# Patient Record
Sex: Female | Born: 1980 | Race: Black or African American | Hispanic: No | Marital: Single | Smoking: Never smoker
Health system: Southern US, Community
[De-identification: ages and names within clinical notes are randomized; demographics above are authoritative.]

## PROBLEM LIST (undated history)

## (undated) DIAGNOSIS — D573 Sickle-cell trait: Secondary | ICD-10-CM

---

## 2001-07-13 ENCOUNTER — Emergency Department (HOSPITAL_COMMUNITY): Admission: EM | Admit: 2001-07-13 | Discharge: 2001-07-13 | Payer: Self-pay | Admitting: Emergency Medicine

## 2001-07-14 ENCOUNTER — Encounter: Payer: Self-pay | Admitting: *Deleted

## 2001-07-14 ENCOUNTER — Emergency Department (HOSPITAL_COMMUNITY): Admission: EM | Admit: 2001-07-14 | Discharge: 2001-07-14 | Payer: Self-pay | Admitting: *Deleted

## 2005-01-07 ENCOUNTER — Emergency Department: Payer: Self-pay | Admitting: Emergency Medicine

## 2005-10-15 ENCOUNTER — Emergency Department: Payer: Self-pay | Admitting: Emergency Medicine

## 2007-02-06 ENCOUNTER — Encounter: Payer: Self-pay | Admitting: Maternal & Fetal Medicine

## 2007-03-06 ENCOUNTER — Encounter: Payer: Self-pay | Admitting: Maternal & Fetal Medicine

## 2007-03-27 ENCOUNTER — Encounter: Payer: Self-pay | Admitting: Maternal & Fetal Medicine

## 2007-05-11 ENCOUNTER — Encounter: Payer: Self-pay | Admitting: Maternal & Fetal Medicine

## 2007-06-08 ENCOUNTER — Encounter: Payer: Self-pay | Admitting: Maternal & Fetal Medicine

## 2007-07-06 ENCOUNTER — Encounter: Payer: Self-pay | Admitting: Maternal & Fetal Medicine

## 2007-07-29 ENCOUNTER — Observation Stay: Payer: Self-pay

## 2007-08-05 ENCOUNTER — Observation Stay: Payer: Self-pay

## 2007-08-11 ENCOUNTER — Observation Stay: Payer: Self-pay | Admitting: Obstetrics and Gynecology

## 2007-08-13 ENCOUNTER — Inpatient Hospital Stay: Payer: Self-pay | Admitting: Obstetrics & Gynecology

## 2009-03-16 ENCOUNTER — Emergency Department (HOSPITAL_COMMUNITY): Admission: EM | Admit: 2009-03-16 | Discharge: 2009-03-16 | Payer: Self-pay | Admitting: Emergency Medicine

## 2009-05-14 ENCOUNTER — Emergency Department: Payer: Self-pay | Admitting: Emergency Medicine

## 2010-07-29 LAB — URINALYSIS, ROUTINE W REFLEX MICROSCOPIC
Bilirubin Urine: NEGATIVE
Glucose, UA: NEGATIVE mg/dL
Ketones, ur: NEGATIVE mg/dL
Leukocytes, UA: NEGATIVE
Nitrite: NEGATIVE
Protein, ur: NEGATIVE mg/dL
Specific Gravity, Urine: 1.015 (ref 1.005–1.030)
Urobilinogen, UA: 1 mg/dL (ref 0.0–1.0)
pH: 6 (ref 5.0–8.0)

## 2010-07-29 LAB — URINE MICROSCOPIC-ADD ON

## 2010-07-29 LAB — RAPID STREP SCREEN (MED CTR MEBANE ONLY): Streptococcus, Group A Screen (Direct): POSITIVE — AB

## 2010-07-29 LAB — URINE CULTURE: Colony Count: 40000

## 2010-09-18 ENCOUNTER — Emergency Department: Payer: Self-pay | Admitting: Emergency Medicine

## 2010-10-19 ENCOUNTER — Encounter: Payer: Self-pay | Admitting: Family Medicine

## 2010-10-25 ENCOUNTER — Encounter: Payer: Self-pay | Admitting: Family Medicine

## 2010-11-18 DIAGNOSIS — E669 Obesity, unspecified: Secondary | ICD-10-CM | POA: Insufficient documentation

## 2010-11-18 DIAGNOSIS — M549 Dorsalgia, unspecified: Secondary | ICD-10-CM | POA: Insufficient documentation

## 2010-11-18 DIAGNOSIS — M25569 Pain in unspecified knee: Secondary | ICD-10-CM | POA: Insufficient documentation

## 2011-03-07 ENCOUNTER — Emergency Department: Payer: Self-pay | Admitting: Internal Medicine

## 2012-05-16 ENCOUNTER — Emergency Department: Payer: Self-pay | Admitting: Emergency Medicine

## 2012-05-16 LAB — BASIC METABOLIC PANEL
Anion Gap: 6 — ABNORMAL LOW (ref 7–16)
Co2: 26 mmol/L (ref 21–32)
EGFR (Non-African Amer.): >60
Glucose: 106 mg/dL — ABNORMAL HIGH (ref 65–99)
Potassium: 4 mmol/L (ref 3.5–5.1)
Sodium: 139 mmol/L (ref 136–145)

## 2012-05-16 LAB — CBC
MCH: 24.5 pg — ABNORMAL LOW (ref 26.0–34.0)
MCHC: 34 g/dL (ref 32.0–36.0)
RBC: 4.68 10*6/uL (ref 3.80–5.20)
RDW: 17 % — ABNORMAL HIGH (ref 11.5–14.5)
WBC: 10 10*3/uL (ref 3.6–11.0)

## 2012-05-16 LAB — CK TOTAL AND CKMB (NOT AT ARMC): CK-MB: <0.5 ng/mL — ABNORMAL LOW (ref 0.5–3.6)

## 2012-05-23 ENCOUNTER — Emergency Department: Payer: Self-pay | Admitting: Emergency Medicine

## 2012-05-23 LAB — URINALYSIS, COMPLETE
Bilirubin,UR: NEGATIVE
Glucose,UR: NEGATIVE mg/dL (ref 0–75)
Ketone: NEGATIVE
Ph: 6 (ref 4.5–8.0)
Protein: NEGATIVE
RBC,UR: <1 /HPF (ref 0–5)
Squamous Epithelial: 4
WBC UR: 2 /HPF (ref 0–5)

## 2012-05-23 LAB — CBC
HGB: 11.6 g/dL — ABNORMAL LOW (ref 12.0–16.0)
MCH: 24.3 pg — ABNORMAL LOW (ref 26.0–34.0)
MCHC: 33.6 g/dL (ref 32.0–36.0)
MCV: 72 fL — ABNORMAL LOW (ref 80–100)
Platelet: 157 10*3/uL (ref 150–440)
RBC: 4.79 10*6/uL (ref 3.80–5.20)
RDW: 16.8 % — ABNORMAL HIGH (ref 11.5–14.5)
WBC: 9.3 10*3/uL (ref 3.6–11.0)

## 2012-05-23 LAB — COMPREHENSIVE METABOLIC PANEL
Albumin: 3.4 g/dL (ref 3.4–5.0)
Alkaline Phosphatase: 67 U/L (ref 50–136)
Anion Gap: 9 (ref 7–16)
Bilirubin,Total: 0.3 mg/dL (ref 0.2–1.0)
Calcium, Total: 8.4 mg/dL — ABNORMAL LOW (ref 8.5–10.1)
Chloride: 108 mmol/L — ABNORMAL HIGH (ref 98–107)
EGFR (African American): >60
EGFR (Non-African Amer.): >60
SGOT(AST): 17 U/L (ref 15–37)
SGPT (ALT): 28 U/L (ref 12–78)
Sodium: 142 mmol/L (ref 136–145)

## 2012-06-13 ENCOUNTER — Emergency Department: Payer: Self-pay | Admitting: Emergency Medicine

## 2012-09-27 ENCOUNTER — Emergency Department: Payer: Self-pay | Admitting: Emergency Medicine

## 2012-12-18 ENCOUNTER — Emergency Department: Payer: Self-pay | Admitting: Emergency Medicine

## 2012-12-18 LAB — COMPREHENSIVE METABOLIC PANEL
Albumin: 3.5 g/dL (ref 3.4–5.0)
Alkaline Phosphatase: 73 U/L (ref 50–136)
BUN: 10 mg/dL (ref 7–18)
Bilirubin,Total: 0.6 mg/dL (ref 0.2–1.0)
Chloride: 106 mmol/L (ref 98–107)
Co2: 26 mmol/L (ref 21–32)
Creatinine: 0.95 mg/dL (ref 0.60–1.30)
EGFR (Non-African Amer.): >60
Glucose: 101 mg/dL — ABNORMAL HIGH (ref 65–99)
Osmolality: 275 (ref 275–301)
SGPT (ALT): 24 U/L (ref 12–78)

## 2012-12-18 LAB — CBC
HCT: 35.2 % (ref 35.0–47.0)
MCH: 24.7 pg — ABNORMAL LOW (ref 26.0–34.0)
MCV: 71 fL — ABNORMAL LOW (ref 80–100)
RDW: 16.4 % — ABNORMAL HIGH (ref 11.5–14.5)
WBC: 9.8 10*3/uL (ref 3.6–11.0)

## 2013-01-01 ENCOUNTER — Ambulatory Visit: Payer: Self-pay | Admitting: Family Medicine

## 2013-01-10 ENCOUNTER — Encounter: Payer: Self-pay | Admitting: Family Medicine

## 2013-02-08 ENCOUNTER — Emergency Department: Payer: Self-pay | Admitting: Emergency Medicine

## 2013-02-08 LAB — URINALYSIS, COMPLETE
Glucose,UR: NEGATIVE mg/dL (ref 0–75)
Ketone: NEGATIVE
Leukocyte Esterase: NEGATIVE
Nitrite: NEGATIVE
Ph: 6 (ref 4.5–8.0)
Protein: NEGATIVE
RBC,UR: 3 /HPF (ref 0–5)
Specific Gravity: 1.021 (ref 1.003–1.030)
Squamous Epithelial: 6
WBC UR: 1 /HPF (ref 0–5)

## 2013-02-08 LAB — COMPREHENSIVE METABOLIC PANEL
Albumin: 3.3 g/dL — ABNORMAL LOW (ref 3.4–5.0)
Alkaline Phosphatase: 71 U/L (ref 50–136)
BUN: 9 mg/dL (ref 7–18)
Bilirubin,Total: 0.4 mg/dL (ref 0.2–1.0)
Calcium, Total: 8.7 mg/dL (ref 8.5–10.1)
Chloride: 107 mmol/L (ref 98–107)
EGFR (African American): >60
EGFR (Non-African Amer.): >60
Glucose: 105 mg/dL — ABNORMAL HIGH (ref 65–99)
Osmolality: 278 (ref 275–301)
SGOT(AST): 15 U/L (ref 15–37)
SGPT (ALT): 30 U/L (ref 12–78)
Sodium: 140 mmol/L (ref 136–145)

## 2013-02-08 LAB — CBC
HGB: 11.4 g/dL — ABNORMAL LOW (ref 12.0–16.0)
MCH: 25 pg — ABNORMAL LOW (ref 26.0–34.0)
Platelet: 160 10*3/uL (ref 150–440)
RBC: 4.55 10*6/uL (ref 3.80–5.20)
RDW: 16.5 % — ABNORMAL HIGH (ref 11.5–14.5)
WBC: 9.8 10*3/uL (ref 3.6–11.0)

## 2013-02-08 LAB — TROPONIN I: Troponin-I: <0.02 ng/mL

## 2013-02-08 LAB — LIPASE, BLOOD: Lipase: 69 U/L — ABNORMAL LOW (ref 73–393)

## 2013-02-08 LAB — PREGNANCY, URINE: Pregnancy Test, Urine: NEGATIVE m[IU]/mL

## 2013-02-09 ENCOUNTER — Emergency Department: Payer: Self-pay | Admitting: Emergency Medicine

## 2013-02-13 ENCOUNTER — Emergency Department: Payer: Self-pay | Admitting: Emergency Medicine

## 2013-02-13 LAB — URINALYSIS, COMPLETE
Bilirubin,UR: NEGATIVE
Blood: NEGATIVE
Hyaline Cast: 22
Ketone: NEGATIVE
Leukocyte Esterase: NEGATIVE
Nitrite: NEGATIVE
Protein: NEGATIVE

## 2013-02-13 LAB — CBC
HCT: 34.1 % — ABNORMAL LOW (ref 35.0–47.0)
HGB: 11.9 g/dL — ABNORMAL LOW (ref 12.0–16.0)
MCHC: 35 g/dL (ref 32.0–36.0)
MCV: 71 fL — ABNORMAL LOW (ref 80–100)
Platelet: 159 10*3/uL (ref 150–440)
RDW: 16.6 % — ABNORMAL HIGH (ref 11.5–14.5)

## 2013-02-13 LAB — LIPASE, BLOOD: Lipase: 58 U/L — ABNORMAL LOW (ref 73–393)

## 2013-02-28 ENCOUNTER — Emergency Department: Payer: Self-pay | Admitting: Emergency Medicine

## 2013-02-28 LAB — COMPREHENSIVE METABOLIC PANEL
Albumin: 3.4 g/dL (ref 3.4–5.0)
Anion Gap: 4 — ABNORMAL LOW (ref 7–16)
Bilirubin,Total: 0.4 mg/dL (ref 0.2–1.0)
Calcium, Total: 8.7 mg/dL (ref 8.5–10.1)
Chloride: 110 mmol/L — ABNORMAL HIGH (ref 98–107)
Creatinine: 1.11 mg/dL (ref 0.60–1.30)
EGFR (African American): >60
EGFR (Non-African Amer.): >60
Glucose: 112 mg/dL — ABNORMAL HIGH (ref 65–99)
Osmolality: 280 (ref 275–301)
Potassium: 3.7 mmol/L (ref 3.5–5.1)
SGPT (ALT): 32 U/L (ref 12–78)
Sodium: 140 mmol/L (ref 136–145)
Total Protein: 7 g/dL (ref 6.4–8.2)

## 2013-02-28 LAB — URINALYSIS, COMPLETE
Bacteria: NONE SEEN
Bilirubin,UR: NEGATIVE
Glucose,UR: NEGATIVE mg/dL (ref 0–75)
Leukocyte Esterase: NEGATIVE
Ph: 6 (ref 4.5–8.0)
Protein: NEGATIVE
RBC,UR: 1 /HPF (ref 0–5)
Squamous Epithelial: 5

## 2013-02-28 LAB — CBC
MCH: 25.1 pg — ABNORMAL LOW (ref 26.0–34.0)
RDW: 16.7 % — ABNORMAL HIGH (ref 11.5–14.5)
WBC: 10.3 10*3/uL (ref 3.6–11.0)

## 2013-02-28 LAB — LIPASE, BLOOD: Lipase: 54 U/L — ABNORMAL LOW (ref 73–393)

## 2013-03-03 ENCOUNTER — Emergency Department: Payer: Self-pay | Admitting: Emergency Medicine

## 2013-03-03 LAB — COMPREHENSIVE METABOLIC PANEL
BUN: 10 mg/dL (ref 7–18)
Bilirubin,Total: 0.4 mg/dL (ref 0.2–1.0)
Calcium, Total: 8.5 mg/dL (ref 8.5–10.1)
Chloride: 108 mmol/L — ABNORMAL HIGH (ref 98–107)
Co2: 27 mmol/L (ref 21–32)
EGFR (African American): >60
EGFR (Non-African Amer.): >60
Glucose: 109 mg/dL — ABNORMAL HIGH (ref 65–99)
Osmolality: 281 (ref 275–301)
SGOT(AST): 23 U/L (ref 15–37)
SGPT (ALT): 32 U/L (ref 12–78)
Sodium: 141 mmol/L (ref 136–145)

## 2013-03-03 LAB — URINALYSIS, COMPLETE
Blood: NEGATIVE
Leukocyte Esterase: NEGATIVE
Nitrite: NEGATIVE
Protein: NEGATIVE
Specific Gravity: 1.01 (ref 1.003–1.030)
Squamous Epithelial: 7

## 2013-03-03 LAB — CBC
HCT: 33 % — ABNORMAL LOW (ref 35.0–47.0)
MCH: 25.1 pg — ABNORMAL LOW (ref 26.0–34.0)
MCV: 71 fL — ABNORMAL LOW (ref 80–100)
RDW: 16.2 % — ABNORMAL HIGH (ref 11.5–14.5)

## 2013-03-24 ENCOUNTER — Emergency Department: Payer: Self-pay | Admitting: Emergency Medicine

## 2013-03-24 LAB — BASIC METABOLIC PANEL
Chloride: 108 mmol/L — ABNORMAL HIGH (ref 98–107)
Co2: 28 mmol/L (ref 21–32)
EGFR (African American): >60
EGFR (Non-African Amer.): >60
Osmolality: 277 (ref 275–301)
Potassium: 3.9 mmol/L (ref 3.5–5.1)

## 2013-03-24 LAB — CBC
HCT: 34.8 % — ABNORMAL LOW (ref 35.0–47.0)
MCHC: 33.9 g/dL (ref 32.0–36.0)
RBC: 4.84 10*6/uL (ref 3.80–5.20)

## 2013-03-24 LAB — TROPONIN I: Troponin-I: <0.02 ng/mL

## 2013-03-25 LAB — CK TOTAL AND CKMB (NOT AT ARMC): CK-MB: 0.9 ng/mL (ref 0.5–3.6)

## 2013-04-01 ENCOUNTER — Emergency Department: Payer: Self-pay | Admitting: Emergency Medicine

## 2013-04-01 LAB — URINALYSIS, COMPLETE
Bacteria: NONE SEEN
Bilirubin,UR: NEGATIVE
Ketone: NEGATIVE
Nitrite: NEGATIVE
Ph: 5 (ref 4.5–8.0)
WBC UR: 2 /HPF (ref 0–5)

## 2013-04-02 LAB — PREGNANCY, URINE: Pregnancy Test, Urine: NEGATIVE m[IU]/mL

## 2013-04-16 ENCOUNTER — Emergency Department: Payer: Self-pay | Admitting: Emergency Medicine

## 2013-05-03 ENCOUNTER — Emergency Department: Payer: Self-pay | Admitting: Emergency Medicine

## 2013-05-03 LAB — CBC
HCT: 33.2 % — ABNORMAL LOW (ref 35.0–47.0)
HGB: 11.4 g/dL — ABNORMAL LOW (ref 12.0–16.0)
MCH: 24.7 pg — ABNORMAL LOW (ref 26.0–34.0)
MCHC: 34.3 g/dL (ref 32.0–36.0)
MCV: 72 fL — AB (ref 80–100)
PLATELETS: 158 10*3/uL (ref 150–440)
RBC: 4.62 10*6/uL (ref 3.80–5.20)
RDW: 15.8 % — AB (ref 11.5–14.5)
WBC: 9 10*3/uL (ref 3.6–11.0)

## 2013-05-03 LAB — BASIC METABOLIC PANEL
ANION GAP: 4 — AB (ref 7–16)
BUN: 13 mg/dL (ref 7–18)
Calcium, Total: 8.1 mg/dL — ABNORMAL LOW (ref 8.5–10.1)
Chloride: 105 mmol/L (ref 98–107)
Co2: 27 mmol/L (ref 21–32)
Creatinine: 0.88 mg/dL (ref 0.60–1.30)
EGFR (African American): >60
EGFR (Non-African Amer.): >60
Glucose: 98 mg/dL (ref 65–99)
OSMOLALITY: 272 (ref 275–301)
POTASSIUM: 3.6 mmol/L (ref 3.5–5.1)
Sodium: 136 mmol/L (ref 136–145)

## 2013-05-03 LAB — TROPONIN I

## 2013-06-23 ENCOUNTER — Emergency Department (HOSPITAL_COMMUNITY)
Admission: EM | Admit: 2013-06-23 | Discharge: 2013-06-23 | Disposition: A | Discharge status: Home / Self Care | Payer: Medicaid Other | Attending: Emergency Medicine | Admitting: Emergency Medicine

## 2013-06-23 ENCOUNTER — Encounter (HOSPITAL_COMMUNITY): Payer: Self-pay | Admitting: Emergency Medicine

## 2013-06-23 DIAGNOSIS — R109 Unspecified abdominal pain: Secondary | ICD-10-CM

## 2013-06-23 DIAGNOSIS — Z3202 Encounter for pregnancy test, result negative: Secondary | ICD-10-CM | POA: Insufficient documentation

## 2013-06-23 LAB — CBC WITH DIFFERENTIAL/PLATELET
Basophils Absolute: 0 10*3/uL (ref 0.0–0.1)
Basophils Relative: 0 % (ref 0–1)
EOS PCT: 1 % (ref 0–5)
Eosinophils Absolute: 0.1 10*3/uL (ref 0.0–0.7)
HEMATOCRIT: 33.2 % — AB (ref 36.0–46.0)
Hemoglobin: 11.6 g/dL — ABNORMAL LOW (ref 12.0–15.0)
LYMPHS ABS: 3.2 10*3/uL (ref 0.7–4.0)
LYMPHS PCT: 37 % (ref 12–46)
MCH: 24.8 pg — ABNORMAL LOW (ref 26.0–34.0)
MCHC: 34.9 g/dL (ref 30.0–36.0)
MCV: 70.9 fL — ABNORMAL LOW (ref 78.0–100.0)
MONO ABS: 0.6 10*3/uL (ref 0.1–1.0)
Monocytes Relative: 7 % (ref 3–12)
NEUTROS ABS: 4.9 10*3/uL (ref 1.7–7.7)
Neutrophils Relative %: 56 % (ref 43–77)
PLATELETS: 175 10*3/uL (ref 150–400)
RBC: 4.68 MIL/uL (ref 3.87–5.11)
RDW: 15.7 % — AB (ref 11.5–15.5)
WBC: 8.7 10*3/uL (ref 4.0–10.5)

## 2013-06-23 LAB — BASIC METABOLIC PANEL
BUN: 17 mg/dL (ref 6–23)
CO2: 27 meq/L (ref 19–32)
Calcium: 8.8 mg/dL (ref 8.4–10.5)
Chloride: 102 mEq/L (ref 96–112)
Creatinine, Ser: 0.83 mg/dL (ref 0.50–1.10)
GFR calc Af Amer: >90 mL/min (ref >90)
GFR calc non Af Amer: >90 mL/min (ref >90)
GLUCOSE: 104 mg/dL — AB (ref 70–99)
Potassium: 4 mEq/L (ref 3.7–5.3)
SODIUM: 139 meq/L (ref 137–147)

## 2013-06-23 LAB — URINALYSIS, ROUTINE W REFLEX MICROSCOPIC
Bilirubin Urine: NEGATIVE
Glucose, UA: NEGATIVE mg/dL
Hgb urine dipstick: NEGATIVE
Ketones, ur: NEGATIVE mg/dL
Leukocytes, UA: NEGATIVE
Nitrite: NEGATIVE
Protein, ur: NEGATIVE mg/dL
SPECIFIC GRAVITY, URINE: 1.025 (ref 1.005–1.030)
UROBILINOGEN UA: 1 mg/dL (ref 0.0–1.0)
pH: 6 (ref 5.0–8.0)

## 2013-06-23 LAB — PREGNANCY, URINE: PREG TEST UR: NEGATIVE

## 2013-06-23 MED ORDER — FENTANYL CITRATE 0.05 MG/ML IJ SOLN
50.0000 ug | Freq: Once | INTRAMUSCULAR | Status: AC
  Administered 2013-06-23: 50 ug via INTRAVENOUS
  Filled 2013-06-23 (×2): qty 2

## 2013-06-23 NOTE — ED Provider Notes (Signed)
CSN: [id number]     Arrival date & time 06/23/13  0104 History   First MD Initiated Contact with Patient 06/23/13 [phone]     Chief Complaint  Patient presents with  . Flank Pain      Patient is a 33 y.o. female presenting with flank pain. The history is provided by the patient.  Flank Pain This is a new problem. The current episode started 12 to 24 hours ago. The problem occurs constantly. The problem has been gradually improving. Pertinent negatives include no chest pain, no abdominal pain and no shortness of breath. Exacerbated by: movement/palpation. The symptoms are relieved by rest. Treatments tried: aleve. The treatment provided no relief.  pt reports gradual onset of right flank pain over past 12-24 hours No trauma/falls No cp/sob No pleuritic pain No vomiting/diarrhea She reports mild HA as well No fever No dysuria No vag bleeding/discharge  She reports she has had this in the past but never had a diagnosis No h/o kidney stones    PMH - sickle cel trait  History  Substance Use Topics  . Smoking status: Never Smoker   . Smokeless tobacco: Not on file  . Alcohol Use: No   OB History   Grav Para Term Preterm Abortions TAB SAB Ect Mult Living                 Review of Systems  Constitutional: Negative for fever.  Respiratory: Negative for shortness of breath.   Cardiovascular: Negative for chest pain.  Gastrointestinal: Negative for vomiting and abdominal pain.  Genitourinary: Positive for flank pain. Negative for dysuria, vaginal bleeding and vaginal discharge.  Neurological: Negative for weakness.  All other systems reviewed and are negative.      Allergies  Review of patient's allergies indicates no known allergies.  Home Medications  No current outpatient prescriptions on file. BP 144/77  Pulse 86  Temp(Src) 98.2 F (36.8 C) (Oral)  Resp 22  Ht 5\' 3"  (1.6 m)  Wt 327 lb (148.326 kg)  BMI 57.94 kg/m2  SpO2 100% Physical Exam CONSTITUTIONAL: Well  developed/well nourished HEAD: Normocephalic/atraumatic EYES: EOMI/PERRL ENMT: Mucous membranes moist NECK: supple no meningeal signs SPINE:entire spine nontender CV: S1/S2 noted, no murmurs/rubs/gallops noted LUNGS: Lungs are clear to auscultation bilaterally, no apparent distress ABDOMEN: soft, nontender, no rebound or guarding, no RUQ tenderness GU: mild tenderness over right flank, no bruising noted NEURO: Pt is awake/alert, moves all extremitiesx4 EXTREMITIES: pulses normal, full ROM SKIN: warm, color normal PSYCH: no abnormalities of mood noted  ED Course  Procedures Labs Review Labs Reviewed  CBC WITH DIFFERENTIAL - Abnormal; Notable for the following:    Hemoglobin 11.6 (*)    HCT 33.2 (*)    MCV 70.9 (*)    MCH 24.8 (*)    RDW 15.7 (*)    All other components within normal limits  BASIC METABOLIC PANEL - Abnormal; Notable for the following:    Glucose, Bld 104 (*)    All other components within normal limits  URINALYSIS, ROUTINE W REFLEX MICROSCOPIC  PREGNANCY, URINE   Pt well appearing, no distress, already improving on my evaluation, labs reassuring If she has ureteral stone, it is already resolved, but no hematuria so unlikely No focal weakness/mildine back tenderness to suggest acute spinal emergency No focal abd tenderness to suggest need for emergent imaging No CP/SOB reported Defer further workup We discussed strict return precautions Pt feels comfortable for d/c home  MDM   Final diagnoses:  Flank pain  Nursing notes including past medical history and social history reviewed and considered in documentation Labs/vital reviewed and considered     Joya Gaskins, MD 06/23/13 [phone]

## 2013-06-23 NOTE — Discharge Instructions (Signed)

## 2013-06-23 NOTE — ED Notes (Signed)
PT C/O RIGHT SIDED FLANK PAIN AND A HEADACHE. PT TOOK 2 ALEVE WITH NO RELIEF

## 2013-06-26 ENCOUNTER — Emergency Department: Payer: Self-pay | Admitting: Emergency Medicine

## 2013-06-26 LAB — CBC WITH DIFFERENTIAL/PLATELET
Basophil #: 0.1 10*3/uL (ref 0.0–0.1)
Basophil %: 0.8 %
EOS ABS: 0.1 10*3/uL (ref 0.0–0.7)
Eosinophil %: 0.7 %
HCT: 34 % — ABNORMAL LOW (ref 35.0–47.0)
HGB: 11.8 g/dL — ABNORMAL LOW (ref 12.0–16.0)
LYMPHS PCT: 35.5 %
Lymphocyte #: 3.6 10*3/uL (ref 1.0–3.6)
MCH: 25.2 pg — ABNORMAL LOW (ref 26.0–34.0)
MCHC: 34.6 g/dL (ref 32.0–36.0)
MCV: 73 fL — AB (ref 80–100)
Monocyte #: 0.8 x10 3/mm (ref 0.2–0.9)
Monocyte %: 7.7 %
NEUTROS PCT: 55.3 %
Neutrophil #: 5.6 10*3/uL (ref 1.4–6.5)
PLATELETS: 167 10*3/uL (ref 150–440)
RBC: 4.67 10*6/uL (ref 3.80–5.20)
RDW: 16.2 % — ABNORMAL HIGH (ref 11.5–14.5)
WBC: 10.1 10*3/uL (ref 3.6–11.0)

## 2013-06-26 LAB — URINALYSIS, COMPLETE
BLOOD: NEGATIVE
Bacteria: NONE SEEN
Bilirubin,UR: NEGATIVE
Glucose,UR: NEGATIVE mg/dL (ref 0–75)
KETONE: NEGATIVE
Leukocyte Esterase: NEGATIVE
Nitrite: NEGATIVE
Ph: 5 (ref 4.5–8.0)
Protein: NEGATIVE
RBC,UR: 2 /HPF (ref 0–5)
SPECIFIC GRAVITY: 1.025 (ref 1.003–1.030)
WBC UR: NONE SEEN /HPF (ref 0–5)

## 2013-06-26 LAB — TROPONIN I

## 2013-06-26 LAB — COMPREHENSIVE METABOLIC PANEL
ALT: 26 U/L (ref 12–78)
Albumin: 3.5 g/dL (ref 3.4–5.0)
Alkaline Phosphatase: 66 U/L
Anion Gap: 4 — ABNORMAL LOW (ref 7–16)
BUN: 12 mg/dL (ref 7–18)
Bilirubin,Total: 0.5 mg/dL (ref 0.2–1.0)
CALCIUM: 8.3 mg/dL — AB (ref 8.5–10.1)
CO2: 27 mmol/L (ref 21–32)
Chloride: 107 mmol/L (ref 98–107)
Creatinine: 0.94 mg/dL (ref 0.60–1.30)
EGFR (Non-African Amer.): >60
Glucose: 98 mg/dL (ref 65–99)
OSMOLALITY: 275 (ref 275–301)
Potassium: 3.8 mmol/L (ref 3.5–5.1)
SGOT(AST): 22 U/L (ref 15–37)
SODIUM: 138 mmol/L (ref 136–145)
Total Protein: 7.5 g/dL (ref 6.4–8.2)

## 2013-06-26 LAB — LIPASE, BLOOD: LIPASE: 71 U/L — AB (ref 73–393)

## 2013-06-27 ENCOUNTER — Encounter (HOSPITAL_COMMUNITY): Payer: Self-pay | Admitting: Emergency Medicine

## 2013-06-27 DIAGNOSIS — R51 Headache: Secondary | ICD-10-CM | POA: Insufficient documentation

## 2013-06-27 DIAGNOSIS — R059 Cough, unspecified: Secondary | ICD-10-CM | POA: Insufficient documentation

## 2013-06-27 DIAGNOSIS — R05 Cough: Secondary | ICD-10-CM | POA: Insufficient documentation

## 2013-06-27 DIAGNOSIS — R42 Dizziness and giddiness: Secondary | ICD-10-CM | POA: Insufficient documentation

## 2013-06-27 DIAGNOSIS — J3489 Other specified disorders of nose and nasal sinuses: Secondary | ICD-10-CM | POA: Insufficient documentation

## 2013-06-27 DIAGNOSIS — R0789 Other chest pain: Secondary | ICD-10-CM | POA: Insufficient documentation

## 2013-06-27 NOTE — ED Notes (Signed)
Patient complaining of chest pain with headache starting earlier today.

## 2013-06-27 NOTE — ED Notes (Signed)
Gave copy of EKG to Dr Rubin PayorPickering. Advised MD that patient would be kept in triage until a room is available. No further orders given at this time.

## 2013-06-28 ENCOUNTER — Emergency Department (HOSPITAL_COMMUNITY): Payer: Medicaid Other

## 2013-06-28 ENCOUNTER — Emergency Department (HOSPITAL_COMMUNITY)
Admission: EM | Admit: 2013-06-28 | Discharge: 2013-06-28 | Disposition: A | Discharge status: Home / Self Care | Payer: Medicaid Other | Attending: Emergency Medicine | Admitting: Emergency Medicine

## 2013-06-28 DIAGNOSIS — R519 Headache, unspecified: Secondary | ICD-10-CM

## 2013-06-28 DIAGNOSIS — R079 Chest pain, unspecified: Secondary | ICD-10-CM

## 2013-06-28 DIAGNOSIS — R51 Headache: Secondary | ICD-10-CM

## 2013-06-28 LAB — COMPREHENSIVE METABOLIC PANEL
ALBUMIN: 3.5 g/dL (ref 3.5–5.2)
ALK PHOS: 57 U/L (ref 39–117)
ALT: 18 U/L (ref 0–35)
AST: 14 U/L (ref 0–37)
BILIRUBIN TOTAL: 0.6 mg/dL (ref 0.3–1.2)
BUN: 13 mg/dL (ref 6–23)
CHLORIDE: 105 meq/L (ref 96–112)
CO2: 26 meq/L (ref 19–32)
Calcium: 9 mg/dL (ref 8.4–10.5)
Creatinine, Ser: 0.87 mg/dL (ref 0.50–1.10)
GFR calc Af Amer: >90 mL/min (ref >90)
GFR, EST NON AFRICAN AMERICAN: 87 mL/min — AB (ref >90)
GLUCOSE: 105 mg/dL — AB (ref 70–99)
POTASSIUM: 3.9 meq/L (ref 3.7–5.3)
Sodium: 141 mEq/L (ref 137–147)
Total Protein: 7.1 g/dL (ref 6.0–8.3)

## 2013-06-28 LAB — CBC WITH DIFFERENTIAL/PLATELET
BASOS PCT: 0 % (ref 0–1)
Basophils Absolute: 0 10*3/uL (ref 0.0–0.1)
EOS PCT: 1 % (ref 0–5)
Eosinophils Absolute: 0.1 10*3/uL (ref 0.0–0.7)
HEMATOCRIT: 30.5 % — AB (ref 36.0–46.0)
Hemoglobin: 10.6 g/dL — ABNORMAL LOW (ref 12.0–15.0)
Lymphocytes Relative: 37 % (ref 12–46)
Lymphs Abs: 3 10*3/uL (ref 0.7–4.0)
MCH: 24.7 pg — ABNORMAL LOW (ref 26.0–34.0)
MCHC: 34.8 g/dL (ref 30.0–36.0)
MCV: 71.1 fL — ABNORMAL LOW (ref 78.0–100.0)
MONO ABS: 0.6 10*3/uL (ref 0.1–1.0)
MONOS PCT: 8 % (ref 3–12)
NEUTROS ABS: 4.4 10*3/uL (ref 1.7–7.7)
Neutrophils Relative %: 54 % (ref 43–77)
Platelets: 137 10*3/uL — ABNORMAL LOW (ref 150–400)
RBC: 4.29 MIL/uL (ref 3.87–5.11)
RDW: 15.6 % — ABNORMAL HIGH (ref 11.5–15.5)
WBC: 8.1 10*3/uL (ref 4.0–10.5)

## 2013-06-28 LAB — I-STAT TROPONIN, ED: TROPONIN I, POC: 0.01 ng/mL (ref 0.00–0.08)

## 2013-06-28 MED ORDER — AMOXICILLIN 500 MG PO CAPS: 500.0000 mg | ORAL_CAPSULE | Freq: Three times a day (TID) | ORAL | Status: DC

## 2013-06-28 NOTE — ED Notes (Signed)
Pt reports head & chest hurting since yesterday.

## 2013-06-28 NOTE — ED Notes (Signed)
Pt alert & oriented x4, stable gait. Patient given discharge instructions, paperwork & prescription(s). Patient  instructed to stop at the registration desk to finish any additional paperwork. Patient verbalized understanding. Pt left department w/ no further questions. 

## 2013-06-28 NOTE — ED Provider Notes (Signed)
CSN: [id number]     Arrival date & time 06/27/13  2028 History  This chart was scribed for Danielle Lyons, MD by Danella Maiers, ED Scribe. This patient was seen in room [facility]14[facility]14 and the patient's care was started at 12:12 AM.    Chief Complaint  Patient presents with  . Chest Pain  . Headache   The history is provided by the patient. No language interpreter was used.   HPI Comments: Danielle Pineda is a 33 y.o. female who presents to the Emergency Department complaining of headache and chest congestion that started this morning. She states she started coughing one hour ago. She reports 2 episodes of dizziness today. She denies h/o migraines. She denies fevers or ear pain. She denies any trauma or falls. She states her kids have been sick. She is otherwise healthy and on no daily medications.   History reviewed. No pertinent past medical history. History reviewed. No pertinent past surgical history. History reviewed. No pertinent family history. History  Substance Use Topics  . Smoking status: Never Smoker   . Smokeless tobacco: Not on file  . Alcohol Use: No   OB History   Grav Para Term Preterm Abortions TAB SAB Ect Mult Living                 Review of Systems  Constitutional: Negative for fever.  HENT: Positive for congestion. Negative for ear pain.   Respiratory: Positive for cough.   Neurological: Positive for dizziness and headaches.   A complete 10 system review of systems was obtained and all systems are negative except as noted in the HPI and PMH.     Allergies  Review of patient's allergies indicates no known allergies.  Home Medications  No current outpatient prescriptions on file. BP 136/87  Pulse 87  Temp(Src) 98.6 F (37 C) (Oral)  Resp 20  Ht 5\' 3"  (1.6 m)  Wt 327 lb (148.326 kg)  BMI 57.94 kg/m2  SpO2 100% Physical Exam  Nursing note and vitals reviewed. Constitutional: She is oriented to person, place, and time. She appears well-developed and  well-nourished. No distress.  HENT:  Head: Normocephalic and atraumatic.  Right Ear: Tympanic membrane normal.  Left Ear: Tympanic membrane normal.  Mouth/Throat: Oropharynx is clear and moist.  Maxillofacial tenderness to palpation  Eyes: EOM are normal. Pupils are equal, round, and reactive to light.  No papilledema  Neck: Normal range of motion. Neck supple. No tracheal deviation present.  Cardiovascular: Normal rate.   Pulmonary/Chest: Effort normal. No respiratory distress.  Musculoskeletal: Normal range of motion.  Neurological: She is alert and oriented to person, place, and time.  Skin: Skin is warm and dry.  Psychiatric: She has a normal mood and affect. Her behavior is normal.    ED Course  Procedures (including critical care time) Medications - No data to display  DIAGNOSTIC STUDIES: Oxygen Saturation is 100% on RA, normal by my interpretation.    COORDINATION OF CARE: 12:17 AM- Discussed treatment plan with pt. Pt agrees to plan.    Labs Review Labs Reviewed  CBC WITH DIFFERENTIAL  COMPREHENSIVE METABOLIC PANEL  I-STAT TROPOININ, ED   Imaging Review No results found.   EKG Interpretation   Date/Time:  Wednesday June 27 2013 20:28:30 EST Ventricular Rate:  90 PR Interval:  188 QRS Duration: 82 QT Interval:  368 QTC Calculation: 450 R Axis:   55 Text Interpretation:  Normal sinus rhythm Normal ECG No previous ECGs  available Confirmed by DELOS  MD, Riley LamUGLAS ([id number]) on 06/28/2013 12:20:37 AM      MDM   Final diagnoses:  None    Patient is a 33 year old female with no significant past medical history. She presents today with complaints of facial discomfort, postnasal drip, and cough. Neurologic exam is unremarkable. Remainder physical examination is unremarkable as well with the exception of maxillofacial tenderness. Her symptoms sound clinically like sinusitis, however there is no evidence for this on the CAT scan. There is no evidence for other  pathology as well. I will recommend ibuprofen, rest, and time. If she's not feeling better in the next 2-3 days, I've provided her with a prescription for amoxicillin which she can fill to treat presumptive sinusitis.  I personally performed the services described in this documentation, which was scribed in my presence. The recorded information has been reviewed and is accurate.      Danielle Lyonsouglas Jazsmin Couse, MD 06/28/13 [phone]

## 2013-06-28 NOTE — Discharge Instructions (Signed)
Ibuprofen 600 mg every 6 hours as needed for pain.  If your symptoms are not improving in the next 2 days, fill the prescription for amoxicillin you have been provided.  Return to the ER if your symptoms substantially worsen or change.   Headaches, Frequently Asked Questions MIGRAINE HEADACHES Q: What is migraine? What causes it? How can I treat it? A: Generally, migraine headaches begin as a dull ache. Then they develop into a constant, throbbing, and pulsating pain. You may experience pain at the temples. You may experience pain at the front or back of one or both sides of the head. The pain is usually accompanied by a combination of:  Nausea.  Vomiting.  Sensitivity to light and noise. Some people (about 15%) experience an aura (see below) before an attack. The cause of migraine is believed to be chemical reactions in the brain. Treatment for migraine may include over-the-counter or prescription medications. It may also include self-help techniques. These include relaxation training and biofeedback.  Q: What is an aura? A: About 15% of people with migraine get an "aura". This is a sign of neurological symptoms that occur before a migraine headache. You may see wavy or jagged lines, dots, or flashing lights. You might experience tunnel vision or blind spots in one or both eyes. The aura can include visual or auditory hallucinations (something imagined). It may include disruptions in smell (such as strange odors), taste or touch. Other symptoms include:  Numbness.  A "pins and needles" sensation.  Difficulty in recalling or speaking the correct word. These neurological events may last as long as 60 minutes. These symptoms will fade as the headache begins. Q: What is a trigger? A: Certain physical or environmental factors can lead to or "trigger" a migraine. These include:  Foods.  Hormonal changes.  Weather.  Stress. It is important to remember that triggers are different for  everyone. To help prevent migraine attacks, you need to figure out which triggers affect you. Keep a headache diary. This is a good way to track triggers. The diary will help you talk to your healthcare professional about your condition. Q: Does weather affect migraines? A: Bright sunshine, hot, humid conditions, and drastic changes in barometric pressure may lead to, or "trigger," a migraine attack in some people. But studies have shown that weather does not act as a trigger for everyone with migraines. Q: What is the link between migraine and hormones? A: Hormones start and regulate many of your body's functions. Hormones keep your body in balance within a constantly changing environment. The levels of hormones in your body are unbalanced at times. Examples are during menstruation, pregnancy, or menopause. That can lead to a migraine attack. In fact, about three quarters of all women with migraine report that their attacks are related to the menstrual cycle.  Q: Is there an increased risk of stroke for migraine sufferers? A: The likelihood of a migraine attack causing a stroke is very remote. That is not to say that migraine sufferers cannot have a stroke associated with their migraines. In persons under age 42, the most common associated factor for stroke is migraine headache. But over the course of a person's normal life span, the occurrence of migraine headache may actually be associated with a reduced risk of dying from cerebrovascular disease due to stroke.  Q: What are acute medications for migraine? A: Acute medications are used to treat the pain of the headache after it has started. Examples over-the-counter medications, NSAIDs, ergots,  and triptans.  Q: What are the triptans? A: Triptans are the newest class of abortive medications. They are specifically targeted to treat migraine. Triptans are vasoconstrictors. They moderate some chemical reactions in the brain. The triptans work on receptors  in your brain. Triptans help to restore the balance of a neurotransmitter called serotonin. Fluctuations in levels of serotonin are thought to be a main cause of migraine.  Q: Are over-the-counter medications for migraine effective? A: Over-the-counter, or "OTC," medications may be effective in relieving mild to moderate pain and associated symptoms of migraine. But you should see your caregiver before beginning any treatment regimen for migraine.  Q: What are preventive medications for migraine? A: Preventive medications for migraine are sometimes referred to as "prophylactic" treatments. They are used to reduce the frequency, severity, and length of migraine attacks. Examples of preventive medications include antiepileptic medications, antidepressants, beta-blockers, calcium channel blockers, and NSAIDs (nonsteroidal anti-inflammatory drugs). Q: Why are anticonvulsants used to treat migraine? A: During the past few years, there has been an increased interest in antiepileptic drugs for the prevention of migraine. They are sometimes referred to as "anticonvulsants". Both epilepsy and migraine may be caused by similar reactions in the brain.  Q: Why are antidepressants used to treat migraine? A: Antidepressants are typically used to treat people with depression. They may reduce migraine frequency by regulating chemical levels, such as serotonin, in the brain.  Q: What alternative therapies are used to treat migraine? A: The term "alternative therapies" is often used to describe treatments considered outside the scope of conventional Western medicine. Examples of alternative therapy include acupuncture, acupressure, and yoga. Another common alternative treatment is herbal therapy. Some herbs are believed to relieve headache pain. Always discuss alternative therapies with your caregiver before proceeding. Some herbal products contain arsenic and other toxins. TENSION HEADACHES Q: What is a tension-type  headache? What causes it? How can I treat it? A: Tension-type headaches occur randomly. They are often the result of temporary stress, anxiety, fatigue, or anger. Symptoms include soreness in your temples, a tightening band-like sensation around your head (a "vice-like" ache). Symptoms can also include a pulling feeling, pressure sensations, and contracting head and neck muscles. The headache begins in your forehead, temples, or the back of your head and neck. Treatment for tension-type headache may include over-the-counter or prescription medications. Treatment may also include self-help techniques such as relaxation training and biofeedback. CLUSTER HEADACHES Q: What is a cluster headache? What causes it? How can I treat it? A: Cluster headache gets its name because the attacks come in groups. The pain arrives with little, if any, warning. It is usually on one side of the head. A tearing or bloodshot eye and a runny nose on the same side of the headache may also accompany the pain. Cluster headaches are believed to be caused by chemical reactions in the brain. They have been described as the most severe and intense of any headache type. Treatment for cluster headache includes prescription medication and oxygen. SINUS HEADACHES Q: What is a sinus headache? What causes it? How can I treat it? A: When a cavity in the bones of the face and skull (a sinus) becomes inflamed, the inflammation will cause localized pain. This condition is usually the result of an allergic reaction, a tumor, or an infection. If your headache is caused by a sinus blockage, such as an infection, you will probably have a fever. An x-ray will confirm a sinus blockage. Your caregiver's treatment might include antibiotics  for the infection, as well as antihistamines or decongestants.  REBOUND HEADACHES Q: What is a rebound headache? What causes it? How can I treat it? A: A pattern of taking acute headache medications too often can lead  to a condition known as "rebound headache." A pattern of taking too much headache medication includes taking it more than 2 days per week or in excessive amounts. That means more than the label or a caregiver advises. With rebound headaches, your medications not only stop relieving pain, they actually begin to cause headaches. Doctors treat rebound headache by tapering the medication that is being overused. Sometimes your caregiver will gradually substitute a different type of treatment or medication. Stopping may be a challenge. Regularly overusing a medication increases the potential for serious side effects. Consult a caregiver if you regularly use headache medications more than 2 days per week or more than the label advises. ADDITIONAL QUESTIONS AND ANSWERS Q: What is biofeedback? A: Biofeedback is a self-help treatment. Biofeedback uses special equipment to monitor your body's involuntary physical responses. Biofeedback monitors:  Breathing.  Pulse.  Heart rate.  Temperature.  Muscle tension.  Brain activity. Biofeedback helps you refine and perfect your relaxation exercises. You learn to control the physical responses that are related to stress. Once the technique has been mastered, you do not need the equipment any more. Q: Are headaches hereditary? A: Four out of five (80%) of people that suffer report a family history of migraine. Scientists are not sure if this is genetic or a family predisposition. Despite the uncertainty, a child has a 50% chance of having migraine if one parent suffers. The child has a 75% chance if both parents suffer.  Q: Can children get headaches? A: By the time they reach high school, most young people have experienced some type of headache. Many safe and effective approaches or medications can prevent a headache from occurring or stop it after it has begun.  Q: What type of doctor should I see to diagnose and treat my headache? A: Start with your primary  caregiver. Discuss his or her experience and approach to headaches. Discuss methods of classification, diagnosis, and treatment. Your caregiver may decide to recommend you to a headache specialist, depending upon your symptoms or other physical conditions. Having diabetes, allergies, etc., may require a more comprehensive and inclusive approach to your headache. The [employer] will provide, upon request, a list of [facility] physician members in your state. Document Released: 07/03/2003 Document Revised: 07/05/2011 Document Reviewed: 12/11/2007 [facility] Patient Information 2014 [place], [place].

## 2013-07-03 ENCOUNTER — Emergency Department: Payer: Self-pay | Admitting: Emergency Medicine

## 2013-07-12 ENCOUNTER — Emergency Department: Payer: Self-pay | Admitting: Emergency Medicine

## 2013-07-12 LAB — CBC WITH DIFFERENTIAL/PLATELET
Basophil #: 0.1 10*3/uL (ref 0.0–0.1)
Basophil %: 0.8 %
Eosinophil #: 0.1 10*3/uL (ref 0.0–0.7)
Eosinophil %: 1 %
HCT: 34.3 % — ABNORMAL LOW (ref 35.0–47.0)
HGB: 11.5 g/dL — ABNORMAL LOW (ref 12.0–16.0)
LYMPHS PCT: 38.7 %
Lymphocyte #: 4 10*3/uL — ABNORMAL HIGH (ref 1.0–3.6)
MCH: 24.4 pg — ABNORMAL LOW (ref 26.0–34.0)
MCHC: 33.5 g/dL (ref 32.0–36.0)
MCV: 73 fL — ABNORMAL LOW (ref 80–100)
MONO ABS: 0.8 x10 3/mm (ref 0.2–0.9)
MONOS PCT: 7.7 %
NEUTROS ABS: 5.3 10*3/uL (ref 1.4–6.5)
Neutrophil %: 51.8 %
Platelet: 158 10*3/uL (ref 150–440)
RBC: 4.72 10*6/uL (ref 3.80–5.20)
RDW: 16.5 % — ABNORMAL HIGH (ref 11.5–14.5)
WBC: 10.3 10*3/uL (ref 3.6–11.0)

## 2013-07-12 LAB — URINALYSIS, COMPLETE
BACTERIA: NONE SEEN
Bilirubin,UR: NEGATIVE
Blood: NEGATIVE
GLUCOSE, UR: NEGATIVE mg/dL (ref 0–75)
Ketone: NEGATIVE
Leukocyte Esterase: NEGATIVE
NITRITE: NEGATIVE
PH: 6 (ref 4.5–8.0)
Protein: NEGATIVE
RBC, UR: NONE SEEN /HPF (ref 0–5)
SPECIFIC GRAVITY: 1.01 (ref 1.003–1.030)
Squamous Epithelial: 3

## 2013-07-12 LAB — COMPREHENSIVE METABOLIC PANEL
ALBUMIN: 3.3 g/dL — AB (ref 3.4–5.0)
ALK PHOS: 66 U/L
ANION GAP: 6 — AB (ref 7–16)
BUN: 15 mg/dL (ref 7–18)
Bilirubin,Total: 0.4 mg/dL (ref 0.2–1.0)
CHLORIDE: 106 mmol/L (ref 98–107)
CO2: 28 mmol/L (ref 21–32)
CREATININE: 1.02 mg/dL (ref 0.60–1.30)
Calcium, Total: 8.6 mg/dL (ref 8.5–10.1)
EGFR (Non-African Amer.): >60
Glucose: 105 mg/dL — ABNORMAL HIGH (ref 65–99)
OSMOLALITY: 281 (ref 275–301)
POTASSIUM: 3.7 mmol/L (ref 3.5–5.1)
SGOT(AST): 9 U/L — ABNORMAL LOW (ref 15–37)
SGPT (ALT): 22 U/L (ref 12–78)
Sodium: 140 mmol/L (ref 136–145)
Total Protein: 7.1 g/dL (ref 6.4–8.2)

## 2013-07-12 LAB — LIPASE, BLOOD: Lipase: 79 U/L (ref 73–393)

## 2013-08-07 ENCOUNTER — Emergency Department: Payer: Self-pay | Admitting: Emergency Medicine

## 2013-08-08 LAB — URINALYSIS, COMPLETE
BILIRUBIN, UR: NEGATIVE
Bacteria: NONE SEEN
Blood: NEGATIVE
Glucose,UR: NEGATIVE mg/dL (ref 0–75)
KETONE: NEGATIVE
LEUKOCYTE ESTERASE: NEGATIVE
NITRITE: NEGATIVE
Ph: 5 (ref 4.5–8.0)
Protein: NEGATIVE
RBC,UR: 1 /HPF (ref 0–5)
SPECIFIC GRAVITY: 1.027 (ref 1.003–1.030)
Squamous Epithelial: 3

## 2013-08-08 LAB — COMPREHENSIVE METABOLIC PANEL
ALT: 25 U/L (ref 12–78)
AST: 12 U/L — AB (ref 15–37)
Albumin: 3.5 g/dL (ref 3.4–5.0)
Alkaline Phosphatase: 62 U/L
Anion Gap: 7 (ref 7–16)
BUN: 14 mg/dL (ref 7–18)
Bilirubin,Total: 0.4 mg/dL (ref 0.2–1.0)
CHLORIDE: 109 mmol/L — AB (ref 98–107)
CO2: 25 mmol/L (ref 21–32)
Calcium, Total: 8.7 mg/dL (ref 8.5–10.1)
Creatinine: 0.97 mg/dL (ref 0.60–1.30)
Glucose: 101 mg/dL — ABNORMAL HIGH (ref 65–99)
OSMOLALITY: 282 (ref 275–301)
Potassium: 3.4 mmol/L — ABNORMAL LOW (ref 3.5–5.1)
Sodium: 141 mmol/L (ref 136–145)
Total Protein: 7.3 g/dL (ref 6.4–8.2)

## 2013-08-08 LAB — CBC WITH DIFFERENTIAL/PLATELET
BASOS ABS: 0 10*3/uL (ref 0.0–0.1)
BASOS PCT: 0.5 %
Eosinophil #: 0.1 10*3/uL (ref 0.0–0.7)
Eosinophil %: 0.7 %
HCT: 35 % (ref 35.0–47.0)
HGB: 11.8 g/dL — ABNORMAL LOW (ref 12.0–16.0)
LYMPHS ABS: 3.2 10*3/uL (ref 1.0–3.6)
Lymphocyte %: 36.6 %
MCH: 24.8 pg — AB (ref 26.0–34.0)
MCHC: 33.9 g/dL (ref 32.0–36.0)
MCV: 73 fL — AB (ref 80–100)
Monocyte #: 0.7 x10 3/mm (ref 0.2–0.9)
Monocyte %: 8.5 %
NEUTROS ABS: 4.7 10*3/uL (ref 1.4–6.5)
Neutrophil %: 53.7 %
PLATELETS: 157 10*3/uL (ref 150–440)
RBC: 4.78 10*6/uL (ref 3.80–5.20)
RDW: 16.6 % — AB (ref 11.5–14.5)
WBC: 8.8 10*3/uL (ref 3.6–11.0)

## 2013-08-08 LAB — LIPASE, BLOOD: LIPASE: 70 U/L — AB (ref 73–393)

## 2013-08-10 ENCOUNTER — Ambulatory Visit (HOSPITAL_COMMUNITY): Admit: 2013-08-10 | Payer: Medicaid Other

## 2013-08-10 ENCOUNTER — Encounter (HOSPITAL_COMMUNITY): Payer: Self-pay | Admitting: Emergency Medicine

## 2013-08-10 ENCOUNTER — Emergency Department (HOSPITAL_COMMUNITY): Payer: Medicaid Other

## 2013-08-10 ENCOUNTER — Emergency Department (HOSPITAL_COMMUNITY)
Admission: EM | Admit: 2013-08-10 | Discharge: 2013-08-10 | Disposition: A | Discharge status: Home / Self Care | Payer: Medicaid Other | Attending: Emergency Medicine | Admitting: Emergency Medicine

## 2013-08-10 ENCOUNTER — Other Ambulatory Visit (HOSPITAL_COMMUNITY): Payer: Self-pay | Admitting: Emergency Medicine

## 2013-08-10 DIAGNOSIS — R1110 Vomiting, unspecified: Secondary | ICD-10-CM

## 2013-08-10 DIAGNOSIS — R1084 Generalized abdominal pain: Secondary | ICD-10-CM | POA: Insufficient documentation

## 2013-08-10 DIAGNOSIS — R1011 Right upper quadrant pain: Secondary | ICD-10-CM | POA: Insufficient documentation

## 2013-08-10 DIAGNOSIS — R51 Headache: Secondary | ICD-10-CM | POA: Insufficient documentation

## 2013-08-10 DIAGNOSIS — R112 Nausea with vomiting, unspecified: Secondary | ICD-10-CM | POA: Insufficient documentation

## 2013-08-10 DIAGNOSIS — Z3202 Encounter for pregnancy test, result negative: Secondary | ICD-10-CM | POA: Insufficient documentation

## 2013-08-10 DIAGNOSIS — Z792 Long term (current) use of antibiotics: Secondary | ICD-10-CM | POA: Insufficient documentation

## 2013-08-10 DIAGNOSIS — R109 Unspecified abdominal pain: Secondary | ICD-10-CM

## 2013-08-10 DIAGNOSIS — R059 Cough, unspecified: Secondary | ICD-10-CM | POA: Insufficient documentation

## 2013-08-10 DIAGNOSIS — Z862 Personal history of diseases of the blood and blood-forming organs and certain disorders involving the immune mechanism: Secondary | ICD-10-CM | POA: Insufficient documentation

## 2013-08-10 DIAGNOSIS — R05 Cough: Secondary | ICD-10-CM | POA: Insufficient documentation

## 2013-08-10 LAB — LIPASE, BLOOD: Lipase: 12 U/L (ref 11–59)

## 2013-08-10 LAB — URINALYSIS, ROUTINE W REFLEX MICROSCOPIC
BILIRUBIN URINE: NEGATIVE
Glucose, UA: NEGATIVE mg/dL
HGB URINE DIPSTICK: NEGATIVE
KETONES UR: NEGATIVE mg/dL
Leukocytes, UA: NEGATIVE
Nitrite: NEGATIVE
Protein, ur: NEGATIVE mg/dL
UROBILINOGEN UA: 0.2 mg/dL (ref 0.0–1.0)
pH: 5.5 (ref 5.0–8.0)

## 2013-08-10 LAB — POC URINE PREG, ED: Preg Test, Ur: NEGATIVE

## 2013-08-10 LAB — CBC
HCT: 33.6 % — ABNORMAL LOW (ref 36.0–46.0)
Hemoglobin: 11.8 g/dL — ABNORMAL LOW (ref 12.0–15.0)
MCH: 24.9 pg — ABNORMAL LOW (ref 26.0–34.0)
MCHC: 35.1 g/dL (ref 30.0–36.0)
MCV: 71 fL — AB (ref 78.0–100.0)
PLATELETS: 151 10*3/uL (ref 150–400)
RBC: 4.73 MIL/uL (ref 3.87–5.11)
RDW: 15.5 % (ref 11.5–15.5)
WBC: 7.8 10*3/uL (ref 4.0–10.5)

## 2013-08-10 LAB — COMPREHENSIVE METABOLIC PANEL
ALT: 18 U/L (ref 0–35)
AST: 16 U/L (ref 0–37)
Albumin: 3.6 g/dL (ref 3.5–5.2)
Alkaline Phosphatase: 54 U/L (ref 39–117)
BUN: 10 mg/dL (ref 6–23)
CO2: 26 meq/L (ref 19–32)
CREATININE: 0.84 mg/dL (ref 0.50–1.10)
Calcium: 8.8 mg/dL (ref 8.4–10.5)
Chloride: 104 mEq/L (ref 96–112)
Glucose, Bld: 106 mg/dL — ABNORMAL HIGH (ref 70–99)
Potassium: 3.5 mEq/L — ABNORMAL LOW (ref 3.7–5.3)
Sodium: 141 mEq/L (ref 137–147)
Total Bilirubin: 0.7 mg/dL (ref 0.3–1.2)
Total Protein: 7.4 g/dL (ref 6.0–8.3)

## 2013-08-10 MED ORDER — SODIUM CHLORIDE 0.9 % IV SOLN
INTRAVENOUS | Status: DC
  Administered 2013-08-10: 01:00:00 via INTRAVENOUS

## 2013-08-10 MED ORDER — IOHEXOL 300 MG/ML  SOLN
50.0000 mL | Freq: Once | INTRAMUSCULAR | Status: AC | PRN
  Administered 2013-08-10: 50 mL via ORAL

## 2013-08-10 MED ORDER — TRAMADOL HCL 50 MG PO TABS: 50.0000 mg | ORAL_TABLET | Freq: Four times a day (QID) | ORAL | Status: DC | PRN

## 2013-08-10 MED ORDER — ONDANSETRON HCL 4 MG PO TABS: 4.0000 mg | ORAL_TABLET | Freq: Four times a day (QID) | ORAL | Status: DC

## 2013-08-10 MED ORDER — FENTANYL CITRATE 0.05 MG/ML IJ SOLN
50.0000 ug | INTRAMUSCULAR | Status: DC | PRN
  Administered 2013-08-10: 50 ug via INTRAVENOUS
  Filled 2013-08-10: qty 2

## 2013-08-10 MED ORDER — ONDANSETRON HCL 4 MG/2ML IJ SOLN
4.0000 mg | Freq: Once | INTRAMUSCULAR | Status: AC
  Administered 2013-08-10: 4 mg via INTRAVENOUS
  Filled 2013-08-10: qty 2

## 2013-08-10 MED ORDER — IOHEXOL 300 MG/ML  SOLN
100.0000 mL | Freq: Once | INTRAMUSCULAR | Status: AC | PRN
  Administered 2013-08-10: 100 mL via INTRAVENOUS

## 2013-08-10 NOTE — ED Notes (Signed)
Patient ambulatory with steady gait to bathroom.  Patient given ice water per request.

## 2013-08-10 NOTE — ED Provider Notes (Signed)
CSN: [id number]     Arrival date & time 08/10/13  0027 History  This chart was scribed for Sunnie NielsenBrian Angela Vazguez, MD by Dorothey Basemania Sutton, ED Scribe. This patient was seen in room APA03/APA03 and the patient's care was started at 12:42 AM.    Chief Complaint  Patient presents with  . Abdominal Pain   Patient is a 33 y.o. female presenting with abdominal pain. The history is provided by the patient. No language interpreter was used.  Abdominal Pain Pain location:  Generalized Pain quality: cramping   Pain radiates to:  Back Pain severity:  Moderate Onset quality:  Gradual Timing:  Constant Progression:  Waxing and waning Chronicity:  New Associated symptoms: nausea and vomiting   Associated symptoms: no chest pain, no chills, no diarrhea, no fever and no shortness of breath   Nausea:    Severity:  Moderate   Onset quality:  Gradual   Timing:  Intermittent   Progression:  Unchanged Vomiting:    Quality:  Stomach contents   Severity:  Moderate   Timing:  Intermittent   Progression:  Unchanged Risk factors: obesity   Risk factors: not elderly and has not had multiple surgeries    HPI Comments: Danielle Pineda is a 33 y.o. female who presents to the Emergency Department complaining of a waxing and waning, cramping/twisting pain to the generalized abdomen, 8/10 currently, with associated nausea and a few episodes of non-bilious, non-bloody emesis onset 4 days ago. Patient reports that the pain will intermittently radiate to the back. She reports an associated, diffuse headache. She denies diarrhea, fever, chills, chest pain, shortness of breath.  Patient has a history of sickle cell anemia.   Past Medical History  Diagnosis Date  . Sickle cell anemia    History reviewed. No pertinent past surgical history. No family history on file. History  Substance Use Topics  . Smoking status: Never Smoker   . Smokeless tobacco: Not on file  . Alcohol Use: No   OB History   Grav Para Term Preterm  Abortions TAB SAB Ect Mult Living                 Review of Systems  Constitutional: Negative for fever and chills.  Respiratory: Negative for shortness of breath.   Cardiovascular: Negative for chest pain.  Gastrointestinal: Positive for nausea, vomiting and abdominal pain. Negative for diarrhea.  Neurological: Positive for headaches.  All other systems reviewed and are negative.     Allergies  Review of patient's allergies indicates no known allergies.  Home Medications   Prior to Admission medications   Medication Sig Start Date End Date Taking? Authorizing Provider  amoxicillin (AMOXIL) 500 MG capsule Take 1 capsule (500 mg total) by mouth 3 (three) times daily. 06/28/13   Geoffery Lyonsouglas Delo, MD   Triage Vitals: BP 102/55  Pulse 86  Temp(Src) 98.4 F (36.9 C) (Oral)  Resp 18  Ht 5\' 3"  (1.6 m)  Wt 327 lb (148.326 kg)  BMI 57.94 kg/m2  SpO2 98%  Physical Exam  Nursing note and vitals reviewed. Constitutional: She is oriented to person, place, and time. She appears well-developed and well-nourished. No distress.  HENT:  Head: Normocephalic and atraumatic.  Eyes: Conjunctivae are normal.  Neck: Normal range of motion. Neck supple.  Cardiovascular: Normal rate, regular rhythm and normal heart sounds.   Pulmonary/Chest: Effort normal and breath sounds normal. No respiratory distress.  Abdominal: She exhibits no distension. There is tenderness. There is no rebound and no guarding.  Mild, diffuse tenderness, worse over the RUQ.   Musculoskeletal: Normal range of motion.  Neurological: She is alert and oriented to person, place, and time.  Skin: Skin is warm and dry.  Psychiatric: She has a normal mood and affect. Her behavior is normal.    ED Course  Procedures (including critical care time)  DIAGNOSTIC STUDIES: Oxygen Saturation is 98% on room air, normal by my interpretation.    COORDINATION OF CARE: 12:46 AM- Will order a pain medication to manage symptoms. Will order  a CT of the abdomen and labs. Discussed treatment plan with patient at bedside and patient verbalized agreement.     Labs Review Labs Reviewed  URINALYSIS, ROUTINE W REFLEX MICROSCOPIC - Abnormal; Notable for the following:    Specific Gravity, Urine >1.030 (*)    All other components within normal limits  CBC - Abnormal; Notable for the following:    Hemoglobin 11.8 (*)    HCT 33.6 (*)    MCV 71.0 (*)    MCH 24.9 (*)    All other components within normal limits  COMPREHENSIVE METABOLIC PANEL - Abnormal; Notable for the following:    Potassium 3.5 (*)    Glucose, Bld 106 (*)    All other components within normal limits  LIPASE, BLOOD  POC URINE PREG, ED    Imaging Review Ct Abdomen Pelvis W Contrast  08/10/2013   CLINICAL DATA:  Generalized abdominal pain  EXAM: CT ABDOMEN AND PELVIS WITH CONTRAST  TECHNIQUE: Multidetector CT imaging of the abdomen and pelvis was performed using the standard protocol following bolus administration of intravenous contrast.  CONTRAST:  50mL OMNIPAQUE IOHEXOL 300 MG/ML SOLN, 100mL OMNIPAQUE IOHEXOL 300 MG/ML SOLN  COMPARISON:  None.  FINDINGS: The lung bases are clear.  The liver demonstrates no focal abnormality. There is no intrahepatic or extrahepatic biliary ductal dilatation. The gallbladder is normal. The spleen demonstrates no focal abnormality. The kidneys, adrenal glands and pancreas are normal. The bladder is unremarkable.  The stomach, duodenum, small intestine, and large intestine demonstrate no contrast extravasation or dilatation. There is a normal caliber appendix in the right lower quadrant without periappendiceal inflammatory changes. There is no pneumoperitoneum, pneumatosis, or portal venous gas. There is no abdominal or pelvic free fluid. There is no lymphadenopathy.  The abdominal aorta is normal in caliber .  There are no lytic or sclerotic osseous lesions.  IMPRESSION: No acute abdominal or pelvic pathology.   Electronically Signed   By:  Elige KoHetal  Patel   On: 08/10/2013 02:25    IV fentanyl. Zofran. IV fluids. On recheck pain improved, has some right upper quadrant tenderness negative Murphy sign. Plan ultrasound which was ordered to be performed in the morning when ultrasonographer is available. No emesis in the ER. Tolerates by mouth fluids. Patient stable for discharge at this time and will return as directed. Prescription for tramadol and Zofran provided.  MDM   Final diagnoses:  Abdominal pain  Vomiting   Evaluated with labs and imaging reviewed as above. No abnormality identified on CT scan. Labs reviewed as above. Pain improved with IV narcotics and IV fluids. Nausea resolved with IV Zofran. Vital signs and nursing notes reviewed and considered.  I personally performed the services described in this documentation, which was scribed in my presence. The recorded information has been reviewed and is accurate.      Sunnie NielsenBrian Jayliah Benett, MD 08/11/13 [phone]

## 2013-08-10 NOTE — Discharge Instructions (Signed)
Abdominal Pain, Adult °Many things can cause abdominal pain. Usually, abdominal pain is not caused by a disease and will improve without treatment. It can often be observed and treated at home. Your health care provider will do a physical exam and possibly order blood tests and X-rays to help determine the seriousness of your pain. However, in many cases, more time must pass before a clear cause of the pain can be found. Before that point, your health care provider may not know if you need more testing or further treatment. °HOME CARE INSTRUCTIONS  °Monitor your abdominal pain for any changes. The following actions may help to alleviate any discomfort you are experiencing: °· Only take over-the-counter or prescription medicines as directed by your health care provider. °· Do not take laxatives unless directed to do so by your health care provider. °· Try a clear liquid diet (broth, tea, or water) as directed by your health care provider. Slowly move to a bland diet as tolerated. °SEEK MEDICAL CARE IF: °· You have unexplained abdominal pain. °· You have abdominal pain associated with nausea or diarrhea. °· You have pain when you urinate or have a bowel movement. °· You experience abdominal pain that wakes you in the night. °· You have abdominal pain that is worsened or improved by eating food. °· You have abdominal pain that is worsened with eating fatty foods. °SEEK IMMEDIATE MEDICAL CARE IF:  °· Your pain does not go away within 2 hours. °· You have a fever. °· You keep throwing up (vomiting). °· Your pain is felt only in portions of the abdomen, such as the right side or the left lower portion of the abdomen. °· You pass bloody or black tarry stools. °MAKE SURE YOU: °· Understand these instructions.   °· Will watch your condition.   °· Will get help right away if you are not doing well or get worse.   °Document Released: 01/20/2005 Document Revised: 01/31/2013 Document Reviewed: 12/20/2012 °[employer]® Patient  Information ©2014 [employer]. ° °

## 2013-08-10 NOTE — ED Notes (Signed)
Patient c/o generalized abdominal pain x 4 days with nausea and vomiting; denies diarrhea.

## 2013-08-10 NOTE — ED Notes (Signed)
Discharge instructions and prescriptions given and reviewed with patient.  Patient verbalized understanding to take medication as directed and sedating effects of Ultram.  Patient instructed to return at 12pm today for ultrasound in radiology.  Patient instructed not to eat until ultrasound.

## 2013-10-01 ENCOUNTER — Emergency Department: Payer: Self-pay | Admitting: Emergency Medicine

## 2013-10-01 LAB — CBC
HCT: 35.8 % (ref 35.0–47.0)
HGB: 11.8 g/dL — ABNORMAL LOW (ref 12.0–16.0)
MCH: 24.2 pg — ABNORMAL LOW (ref 26.0–34.0)
MCHC: 33.1 g/dL (ref 32.0–36.0)
MCV: 73 fL — ABNORMAL LOW (ref 80–100)
Platelet: 146 10*3/uL — ABNORMAL LOW (ref 150–440)
RBC: 4.89 10*6/uL (ref 3.80–5.20)
RDW: 16.3 % — AB (ref 11.5–14.5)
WBC: 9.3 10*3/uL (ref 3.6–11.0)

## 2013-10-01 LAB — BASIC METABOLIC PANEL
Anion Gap: 5 — ABNORMAL LOW (ref 7–16)
BUN: 10 mg/dL (ref 7–18)
CO2: 27 mmol/L (ref 21–32)
CREATININE: 1.07 mg/dL (ref 0.60–1.30)
Calcium, Total: 8.5 mg/dL (ref 8.5–10.1)
Chloride: 109 mmol/L — ABNORMAL HIGH (ref 98–107)
EGFR (Non-African Amer.): >60
Glucose: 89 mg/dL (ref 65–99)
Osmolality: 280 (ref 275–301)
Potassium: 3.3 mmol/L — ABNORMAL LOW (ref 3.5–5.1)
Sodium: 141 mmol/L (ref 136–145)

## 2013-10-01 LAB — TROPONIN I: Troponin-I: <0.02 ng/mL

## 2014-02-09 ENCOUNTER — Emergency Department: Payer: Self-pay | Admitting: Emergency Medicine

## 2014-02-09 LAB — BASIC METABOLIC PANEL
ANION GAP: 8 (ref 7–16)
BUN: 11 mg/dL (ref 7–18)
CALCIUM: 7.6 mg/dL — AB (ref 8.5–10.1)
Chloride: 106 mmol/L (ref 98–107)
Co2: 27 mmol/L (ref 21–32)
Creatinine: 0.89 mg/dL (ref 0.60–1.30)
EGFR (Non-African Amer.): >60
Glucose: 97 mg/dL (ref 65–99)
Osmolality: 281 (ref 275–301)
Potassium: 3.6 mmol/L (ref 3.5–5.1)
Sodium: 141 mmol/L (ref 136–145)

## 2014-02-09 LAB — HEPATIC FUNCTION PANEL A (ARMC)
ALBUMIN: 3.3 g/dL — AB (ref 3.4–5.0)
ALK PHOS: 63 U/L
AST: 20 U/L (ref 15–37)
Bilirubin, Direct: <0.1 mg/dL (ref 0.0–0.2)
Bilirubin,Total: 0.5 mg/dL (ref 0.2–1.0)
SGPT (ALT): 25 U/L
Total Protein: 6.8 g/dL (ref 6.4–8.2)

## 2014-02-09 LAB — CBC
HCT: 34.9 % — AB (ref 35.0–47.0)
HGB: 11.8 g/dL — ABNORMAL LOW (ref 12.0–16.0)
MCH: 24.9 pg — ABNORMAL LOW (ref 26.0–34.0)
MCHC: 33.7 g/dL (ref 32.0–36.0)
MCV: 74 fL — AB (ref 80–100)
PLATELETS: 165 10*3/uL (ref 150–440)
RBC: 4.72 10*6/uL (ref 3.80–5.20)
RDW: 15.9 % — AB (ref 11.5–14.5)
WBC: 9.9 10*3/uL (ref 3.6–11.0)

## 2014-02-09 LAB — TROPONIN I

## 2014-02-09 LAB — LIPASE, BLOOD: Lipase: 59 U/L — ABNORMAL LOW (ref 73–393)

## 2014-02-09 LAB — ETHANOL: Ethanol: <3 mg/dL (ref 0–80)

## 2014-05-14 ENCOUNTER — Emergency Department: Payer: Self-pay | Admitting: Emergency Medicine

## 2014-06-20 ENCOUNTER — Emergency Department: Payer: Self-pay | Admitting: Emergency Medicine

## 2014-09-08 ENCOUNTER — Encounter: Payer: Self-pay | Admitting: Emergency Medicine

## 2014-09-08 ENCOUNTER — Emergency Department
Admission: EM | Admit: 2014-09-08 | Discharge: 2014-09-08 | Disposition: A | Discharge status: Home / Self Care | Payer: Medicaid Other | Attending: Emergency Medicine | Admitting: Emergency Medicine

## 2014-09-08 DIAGNOSIS — Z79899 Other long term (current) drug therapy: Secondary | ICD-10-CM | POA: Insufficient documentation

## 2014-09-08 DIAGNOSIS — Z792 Long term (current) use of antibiotics: Secondary | ICD-10-CM | POA: Insufficient documentation

## 2014-09-08 DIAGNOSIS — K649 Unspecified hemorrhoids: Secondary | ICD-10-CM | POA: Insufficient documentation

## 2014-09-08 HISTORY — DX: Sickle-cell trait: D57.3

## 2014-09-08 NOTE — ED Notes (Signed)
Pt reports noticing blood in stool for past 2 days. Pt reports stool is brown in color denies blood in toilet bowel. Pt states Blood on tissue when she wipes. Pt endorses painful bowel movements. Pt also endorse cramping pain in lower abdomen.

## 2014-09-08 NOTE — ED Notes (Signed)
Patient to Ed with c.o rectal bleeding for the past 2 days. Patient states blood is only present with wiping. Patient is alert and oriented. Patient states straining with bowel movements has been present.

## 2014-09-08 NOTE — ED Provider Notes (Signed)
[facility] Emergency Department Provider Note  ____________________________________________  Time seen: 10:50 AM  I have reviewed the triage vital signs and the nursing notes.   HISTORY  Chief Complaint Rectal Bleeding      HPI Danielle Pineda is a 34 y.o. female who presents with rectal bleeding for 2 days that is mild.She reports blood on the toilet paper, not in the toilet bowl in her stools are brown. No abdominal pain. She is not on any blood thinners. She has never had this before. She does note that she has had hemorrhoids in the past but no bleeding.     Past Medical History  Diagnosis Date  . Sickle cell trait     There are no active problems to display for this patient.   History reviewed. No pertinent past surgical history.  Current Outpatient Rx  Name  Route  Sig  Dispense  Refill  . amoxicillin (AMOXIL) 500 MG capsule   Oral   Take 1 capsule (500 mg total) by mouth 3 (three) times daily.   21 capsule   0   . ondansetron (ZOFRAN) 4 MG tablet   Oral   Take 1 tablet (4 mg total) by mouth every 6 (six) hours.   12 tablet   0   . traMADol (ULTRAM) 50 MG tablet   Oral   Take 1 tablet (50 mg total) by mouth every 6 (six) hours as needed.   15 tablet   0     Allergies Review of patient's allergies indicates no known allergies.  No family history on file.  Social History History  Substance Use Topics  . Smoking status: Never Smoker   . Smokeless tobacco: Not on file  . Alcohol Use: No     Comment: occasionally     Review of Systems  Constitutional: Negative for fever. Eyes: Negative for visual changes. ENT: Negative for sore throat. Cardiovascular: Negative for chest pain. Respiratory: Negative for shortness of breath. Gastrointestinal: Negative for abdominal pain, vomiting and diarrhea. Positive for rectal bleeding Genitourinary: Negative for dysuria. Musculoskeletal: Negative for back pain. Skin: Negative  for rash. Neurological: Negative for headaches, focal weakness or numbness. Psychiatric: Positive for anxiety  10-point ROS otherwise negative.  ____________________________________________   PHYSICAL EXAM:  VITAL SIGNS: ED Triage Vitals  Enc Vitals Group     BP 09/08/14 1034 115/61 mmHg     Pulse Rate 09/08/14 1034 82     Resp 09/08/14 1034 18     Temp 09/08/14 1034 98 F (36.7 C)     Temp Source 09/08/14 1034 Oral     SpO2 09/08/14 1034 100 %     Weight 09/08/14 1034 332 lb (150.594 kg)     Height 09/08/14 1034 5\' 3"  (1.6 m)     Head Cir --      Peak Flow --      Pain Score 09/08/14 1042 4     Pain Loc --      Pain Edu? --      Excl. in GC? --      Constitutional: Alert and oriented. Well appearing and in no distress. Eyes: Conjunctivae are normal.   Cardiovascular: Normal rate, regular rhythm. Normal and symmetric distal pulses are present in all extremities. No murmurs, rubs, or gallops. Respiratory: Normal respiratory effort without tachypnea nor retractions. Breath sounds are clear and equal bilaterally. No wheezes/rales/rhonchi. Gastrointestinal: Soft and nontender. No distention. There is no CVA tenderness. Nonbleeding hemorrhoid noted at 3:00 position  on rectal exam Genitourinary: deferred Musculoskeletal: Nontender with normal range of motion in all extremities. No joint effusions.  No lower extremity tenderness nor edema. Neurologic:  Normal speech and language. No gross focal neurologic deficits are appreciated. Speech is normal.  Skin:  Skin is warm, dry and intact. No rash noted. Psychiatric: Mood and affect are normal. Speech and behavior are normal.  ____________________________________________    LABS (pertinent positives/negatives) None   ____________________________________________   EKG  None  ____________________________________________    RADIOLOGY  None  ____________________________________________   PROCEDURES  Procedure(s)  performed: None  Critical Care performed: None   ____________________________________________   INITIAL IMPRESSION / ASSESSMENT AND PLAN / ED COURSE  Pertinent labs & imaging results that were available during my care of the patient were reviewed by me and considered in my medical decision making (see chart for details).  Patient's history of present illness and exam consistent with hemorrhoidal bleeding. Described proper stooling behavior to patient and over-the-counter treatments. Recommend PCP follow-up if continued difficulties. Return precautions given.  ____________________________________________   FINAL CLINICAL IMPRESSION(S) / ED DIAGNOSES  Final diagnoses:  Hemorrhoids, unspecified hemorrhoid type     Jene Everyobert Damyra Luscher, MD 09/08/14 1101

## 2014-09-08 NOTE — Discharge Instructions (Signed)

## 2014-09-09 ENCOUNTER — Emergency Department
Admission: EM | Admit: 2014-09-09 | Discharge: 2014-09-09 | Disposition: A | Discharge status: Home / Self Care | Payer: Medicaid Other | Attending: Student | Admitting: Student

## 2014-09-09 ENCOUNTER — Encounter: Payer: Self-pay | Admitting: Emergency Medicine

## 2014-09-09 ENCOUNTER — Emergency Department: Payer: Medicaid Other

## 2014-09-09 DIAGNOSIS — R1011 Right upper quadrant pain: Secondary | ICD-10-CM | POA: Insufficient documentation

## 2014-09-09 DIAGNOSIS — R52 Pain, unspecified: Secondary | ICD-10-CM

## 2014-09-09 DIAGNOSIS — Z3202 Encounter for pregnancy test, result negative: Secondary | ICD-10-CM | POA: Insufficient documentation

## 2014-09-09 DIAGNOSIS — R1111 Vomiting without nausea: Secondary | ICD-10-CM

## 2014-09-09 DIAGNOSIS — Z79899 Other long term (current) drug therapy: Secondary | ICD-10-CM | POA: Insufficient documentation

## 2014-09-09 DIAGNOSIS — R1110 Vomiting, unspecified: Secondary | ICD-10-CM | POA: Insufficient documentation

## 2014-09-09 DIAGNOSIS — Z792 Long term (current) use of antibiotics: Secondary | ICD-10-CM | POA: Insufficient documentation

## 2014-09-09 LAB — COMPREHENSIVE METABOLIC PANEL
ALT: 33 U/L (ref 14–54)
AST: 22 U/L (ref 15–41)
Albumin: 3.9 g/dL (ref 3.5–5.0)
Alkaline Phosphatase: 49 U/L (ref 38–126)
Anion gap: 4 — ABNORMAL LOW (ref 5–15)
BUN: 14 mg/dL (ref 6–20)
CALCIUM: 8.9 mg/dL (ref 8.9–10.3)
CO2: 28 mmol/L (ref 22–32)
Chloride: 107 mmol/L (ref 101–111)
Creatinine, Ser: 0.83 mg/dL (ref 0.44–1.00)
GFR calc Af Amer: >60 mL/min (ref >60)
Glucose, Bld: 105 mg/dL — ABNORMAL HIGH (ref 65–99)
Potassium: 3.8 mmol/L (ref 3.5–5.1)
SODIUM: 139 mmol/L (ref 135–145)
TOTAL PROTEIN: 7.1 g/dL (ref 6.5–8.1)
Total Bilirubin: 0.6 mg/dL (ref 0.3–1.2)

## 2014-09-09 LAB — URINALYSIS COMPLETE WITH MICROSCOPIC (ARMC ONLY)
BILIRUBIN URINE: NEGATIVE
Glucose, UA: NEGATIVE mg/dL
HGB URINE DIPSTICK: NEGATIVE
KETONES UR: NEGATIVE mg/dL
Nitrite: NEGATIVE
PH: 6 (ref 5.0–8.0)
Protein, ur: NEGATIVE mg/dL
SPECIFIC GRAVITY, URINE: 1.014 (ref 1.005–1.030)

## 2014-09-09 LAB — CBC
HEMATOCRIT: 34 % — AB (ref 35.0–47.0)
Hemoglobin: 11.7 g/dL — ABNORMAL LOW (ref 12.0–16.0)
MCH: 25 pg — AB (ref 26.0–34.0)
MCHC: 34.4 g/dL (ref 32.0–36.0)
MCV: 72.7 fL — ABNORMAL LOW (ref 80.0–100.0)
PLATELETS: 136 10*3/uL — AB (ref 150–440)
RBC: 4.68 MIL/uL (ref 3.80–5.20)
RDW: 16.1 % — ABNORMAL HIGH (ref 11.5–14.5)
WBC: 8 10*3/uL (ref 3.6–11.0)

## 2014-09-09 LAB — PREGNANCY, URINE: Preg Test, Ur: NEGATIVE

## 2014-09-09 LAB — LIPASE, BLOOD: LIPASE: 27 U/L (ref 22–51)

## 2014-09-09 NOTE — ED Provider Notes (Signed)
[facility] Emergency Department Provider Note  ____________________________________________  Time seen: Approximately 5:07 AM  I have reviewed the triage vital signs and the nursing notes.   HISTORY  Chief Complaint Hematemesis    HPI Danielle Pineda is a 34 y.o. female with history of sickle cell trait, seen in this ER for blood in stools due to hemorrhoid on 09/06/2014 presents for evaluation of right upper quadrant pain and one episode of emesis last night that had a streak of blood in it. She reports the right upper quadrant pain is cramping in nature, came on gradually, was constant, wraps around to the right flank. She has also had mild dysuria but no hematuria. No fevers, no diarrhea. Current severity 7 out of 10. No modifying factors. She reports that soon after eating last night she had one episode of emesis which looked similar to what she had been eating however she saw a streak of blood in it and that concerned her. She has otherwise been in her usual state of health. No modifying factors.   Past Medical History  Diagnosis Date  . Sickle cell trait     There are no active problems to display for this patient.   History reviewed. No pertinent past surgical history.  Current Outpatient Rx  Name  Route  Sig  Dispense  Refill  . amoxicillin (AMOXIL) 500 MG capsule   Oral   Take 1 capsule (500 mg total) by mouth 3 (three) times daily.   21 capsule   0   . ondansetron (ZOFRAN) 4 MG tablet   Oral   Take 1 tablet (4 mg total) by mouth every 6 (six) hours.   12 tablet   0   . traMADol (ULTRAM) 50 MG tablet   Oral   Take 1 tablet (50 mg total) by mouth every 6 (six) hours as needed.   15 tablet   0     Allergies Review of patient's allergies indicates no known allergies.  No family history on file.  Social History History  Substance Use Topics  . Smoking status: Never Smoker   . Smokeless tobacco: Not on file  . Alcohol Use: No     Comment: occasionally     Review of Systems Constitutional: No fever/chills Eyes: No visual changes. ENT: No sore throat. Cardiovascular: Denies chest pain. Respiratory: Denies shortness of breath. Gastrointestinal: + abdominal pain.  + nausea, + vomiting.  No diarrhea.  No constipation. Genitourinary: Negative for dysuria. Musculoskeletal: Negative for back pain. Skin: Negative for rash. Neurological: Negative for headaches, focal weakness or numbness.  10-point ROS otherwise negative.  ____________________________________________   PHYSICAL EXAM:  VITAL SIGNS: ED Triage Vitals  Enc Vitals Group     BP 09/09/14 0228 126/83 mmHg     Pulse Rate 09/09/14 0228 78     Resp 09/09/14 0228 20     Temp 09/09/14 0228 98 F (36.7 C)     Temp Source 09/09/14 0228 Oral     SpO2 09/09/14 0228 100 %     Weight 09/09/14 0228 330 lb (149.687 kg)     Height 09/09/14 0228 5\' 3"  (1.6 m)     Head Cir --      Peak Flow --      Pain Score 09/09/14 0229 6     Pain Loc --      Pain Edu? --      Excl. in GC? --     Constitutional: Alert and oriented. Well  appearing and in no acute distress. Eyes: Conjunctivae are normal. PERRL. EOMI. Head: Atraumatic. Nose: No congestion/rhinnorhea. Mouth/Throat: Mucous membranes are moist.  Oropharynx non-erythematous. Neck: No stridor.  Cardiovascular: Normal rate, regular rhythm. Grossly normal heart sounds.  Good peripheral circulation. Respiratory: Normal respiratory effort.  No retractions. Lungs CTAB. Gastrointestinal: Soft with mild tenderness in the right upper quadrant, no rebound, no guarding, obese abdomen, No distention. No abdominal bruits. No CVA tenderness. Genitourinary: deferred Musculoskeletal: No lower extremity tenderness nor edema.  No joint effusions. Neurologic:  Normal speech and language. No gross focal neurologic deficits are appreciated. Speech is normal. No gait instability. Skin:  Skin is warm, dry and intact. No rash  noted. Psychiatric: Mood and affect are normal. Speech and behavior are normal.  ____________________________________________   LABS (all labs ordered are listed, but only abnormal results are displayed)  Labs Reviewed  CBC - Abnormal; Notable for the following:    Hemoglobin 11.7 (*)    HCT 34.0 (*)    MCV 72.7 (*)    MCH 25.0 (*)    RDW 16.1 (*)    Platelets 136 (*)    All other components within normal limits  COMPREHENSIVE METABOLIC PANEL - Abnormal; Notable for the following:    Glucose, Bld 105 (*)    Anion gap 4 (*)    All other components within normal limits  URINALYSIS COMPLETEWITH MICROSCOPIC ([facility])  - Abnormal; Notable for the following:    Color, Urine YELLOW (*)    APPearance CLOUDY (*)    Leukocytes, UA 1+ (*)    Bacteria, UA RARE (*)    Squamous Epithelial / LPF 6-30 (*)    All other components within normal limits  LIPASE, BLOOD  PREGNANCY, URINE   ____________________________________________  EKG  none ____________________________________________  RADIOLOGY  RUQ US  IMPRESSION: Normal sonographic appearance of the gallbladder and hepatobiliary tree. No gallstones or findings to suggest acute cholecystitis.  ____________________________________________   PROCEDURES  Procedure(s) performed: None  Critical Care performed: No  ____________________________________________   INITIAL IMPRESSION / ASSESSMENT AND PLAN / ED COURSE  Pertinent labs & imaging results that were available during my care of the patient were reviewed by me and considered in my medical decision making (see chart for details).  Danielle LionMelissa A Sharber is a 34 y.o. female with history of sickle cell trait, seen in this ER for blood in stools due to hemorrhoid on 09/06/2014 presents for evaluation of right upper quadrant pain and one episode of emesis last night that had a streak of blood in it. No frank hematemesis. On exam, she is very well-appearing and in no acute distress.  Vital signs stable, afebrile. Mild tenderness to palpation in the right upper quadrant. Plan for screening labs, right upper quadrant ultrasound, urinalysis and urine pregnancy test. Hemoglobin is unchanged compared to prior, she is hemodynamically stable, I doubt any clinically significant GI bleed. Ua contaminated with multiple squamous cells, not c/w UTI. Negative for pregnancy.  07:07 AM US negative. No recurrence of vomiting, tolerating PO. Patient appears well and is requesting dc. DC with return precautions. ____________________________________________   FINAL CLINICAL IMPRESSION(S) / ED DIAGNOSES  Final diagnoses:  Pain  Right upper quadrant pain  Non-intractable vomiting without nausea, vomiting of unspecified type      Gayla DossEryka A Meleane Selinger, MD 09/09/14 0710

## 2014-09-09 NOTE — Discharge Instructions (Signed)

## 2014-09-09 NOTE — ED Notes (Signed)
Patient transported to Ultrasound 

## 2014-09-09 NOTE — ED Notes (Signed)
Pt c/o abdominal pain, RUQ, radiating to R back. Pt states she vomited had streaks of blood in vomit. Pt c/o dizziness and a slight headache.

## 2014-10-02 ENCOUNTER — Emergency Department: Payer: Medicaid Other

## 2014-10-02 ENCOUNTER — Emergency Department
Admission: EM | Admit: 2014-10-02 | Discharge: 2014-10-02 | Disposition: A | Discharge status: Home / Self Care | Payer: Medicaid Other | Attending: Emergency Medicine | Admitting: Emergency Medicine

## 2014-10-02 ENCOUNTER — Encounter: Payer: Self-pay | Admitting: Emergency Medicine

## 2014-10-02 DIAGNOSIS — R05 Cough: Secondary | ICD-10-CM

## 2014-10-02 DIAGNOSIS — R079 Chest pain, unspecified: Secondary | ICD-10-CM | POA: Insufficient documentation

## 2014-10-02 DIAGNOSIS — R51 Headache: Secondary | ICD-10-CM | POA: Insufficient documentation

## 2014-10-02 DIAGNOSIS — J40 Bronchitis, not specified as acute or chronic: Secondary | ICD-10-CM

## 2014-10-02 DIAGNOSIS — R2240 Localized swelling, mass and lump, unspecified lower limb: Secondary | ICD-10-CM | POA: Insufficient documentation

## 2014-10-02 DIAGNOSIS — R059 Cough, unspecified: Secondary | ICD-10-CM

## 2014-10-02 DIAGNOSIS — J209 Acute bronchitis, unspecified: Secondary | ICD-10-CM | POA: Insufficient documentation

## 2014-10-02 MED ORDER — PREDNISONE 20 MG PO TABS: 60.0000 mg | ORAL_TABLET | Freq: Every day | ORAL | Status: AC

## 2014-10-02 MED ORDER — IPRATROPIUM-ALBUTEROL 0.5-2.5 (3) MG/3ML IN SOLN
RESPIRATORY_TRACT | Status: AC
  Filled 2014-10-02: qty 3

## 2014-10-02 MED ORDER — PREDNISONE 20 MG PO TABS
60.0000 mg | ORAL_TABLET | Freq: Once | ORAL | Status: AC
  Administered 2014-10-02: 60 mg via ORAL

## 2014-10-02 MED ORDER — IPRATROPIUM-ALBUTEROL 0.5-2.5 (3) MG/3ML IN SOLN
3.0000 mL | Freq: Once | RESPIRATORY_TRACT | Status: AC
  Administered 2014-10-02: 3 mL via RESPIRATORY_TRACT

## 2014-10-02 MED ORDER — BENZONATATE 100 MG PO CAPS
100.0000 mg | ORAL_CAPSULE | Freq: Once | ORAL | Status: AC
  Administered 2014-10-02: 100 mg via ORAL

## 2014-10-02 MED ORDER — IPRATROPIUM-ALBUTEROL 0.5-2.5 (3) MG/3ML IN SOLN
RESPIRATORY_TRACT | Status: AC
  Administered 2014-10-02: 3 mL via RESPIRATORY_TRACT
  Filled 2014-10-02: qty 3

## 2014-10-02 MED ORDER — BENZONATATE 100 MG PO CAPS
ORAL_CAPSULE | ORAL | Status: AC
  Administered 2014-10-02: 100 mg via ORAL
  Filled 2014-10-02: qty 1

## 2014-10-02 MED ORDER — PREDNISONE 20 MG PO TABS
ORAL_TABLET | ORAL | Status: AC
  Administered 2014-10-02: 60 mg via ORAL
  Filled 2014-10-02: qty 3

## 2014-10-02 MED ORDER — AZITHROMYCIN 250 MG PO TABS: ORAL_TABLET | ORAL | Status: AC

## 2014-10-02 MED ORDER — IBUPROFEN 800 MG PO TABS
ORAL_TABLET | ORAL | Status: AC
  Administered 2014-10-02: 800 mg via ORAL
  Filled 2014-10-02: qty 1

## 2014-10-02 MED ORDER — IBUPROFEN 800 MG PO TABS
800.0000 mg | ORAL_TABLET | Freq: Once | ORAL | Status: AC
  Administered 2014-10-02: 800 mg via ORAL

## 2014-10-02 MED ORDER — ALBUTEROL SULFATE HFA 108 (90 BASE) MCG/ACT IN AERS: 2.0000 | INHALATION_SPRAY | Freq: Four times a day (QID) | RESPIRATORY_TRACT | Status: AC | PRN

## 2014-10-02 MED ORDER — HYDROCOD POLST-CPM POLST ER 10-8 MG/5ML PO SUER: 5.0000 mL | Freq: Two times a day (BID) | ORAL | Status: DC | PRN

## 2014-10-02 NOTE — ED Notes (Signed)
Patient with no complaints at this time. Respirations even and unlabored. Skin warm/dry. Discharge instructions reviewed with patient at this time. Patient given opportunity to voice concerns/ask questions. Patient discharged at this time and left Emergency Department with steady gait.   

## 2014-10-02 NOTE — ED Provider Notes (Signed)
[facility] Emergency Department Provider Note  ____________________________________________  Time seen: Approximately 411 AM  I have reviewed the triage vital signs and the nursing notes.   HISTORY  Chief Complaint Cough    HPI Danielle Pineda is a 34 y.o. female who comes in with a cough for 3 weeks. The patient also reports that she has some chest pain from coughing. She reports that she has been taking Benadryl for the cough because she thought she had allergies but the symptoms have not been getting better. The patient has not had any fevers and has not taken any cough medicine. She denies any sick contacts. She also has some nasal congestion as well. The patient is concerned due to the length of the symptoms that she decided to come in for further evaluation and treatment.The patient reports that her pain as a 7 out of 10 in intensity. She reports that she also does have some throat pain as well. Has occasional shortness of breath when she has coughing spells.   Past Medical History  Diagnosis Date  . Sickle cell trait     There are no active problems to display for this patient.   History reviewed. No pertinent past surgical history.  Current Outpatient Rx  Name  Route  Sig  Dispense  Refill  . amoxicillin (AMOXIL) 500 MG capsule   Oral   Take 1 capsule (500 mg total) by mouth 3 (three) times daily.   21 capsule   0   . azithromycin (ZITHROMAX Z-PAK) 250 MG tablet      Take 2 tablets (500 mg) on  Day 1,  followed by 1 tablet (250 mg) once daily on Days 2 through 5.   6 each   0   . chlorpheniramine-HYDROcodone (TUSSIONEX PENNKINETIC ER) 10-8 MG/5ML SUER   Oral   Take 5 mLs by mouth 2 (two) times daily as needed for cough.   115 mL   0   . ondansetron (ZOFRAN) 4 MG tablet   Oral   Take 1 tablet (4 mg total) by mouth every 6 (six) hours.   12 tablet   0   . predniSONE (DELTASONE) 20 MG tablet   Oral   Take 3 tablets (60 mg total)  by mouth daily.   12 tablet   0   . traMADol (ULTRAM) 50 MG tablet   Oral   Take 1 tablet (50 mg total) by mouth every 6 (six) hours as needed.   15 tablet   0     Allergies Review of patient's allergies indicates no known allergies.  History reviewed. No pertinent family history.  Social History History  Substance Use Topics  . Smoking status: Never Smoker   . Smokeless tobacco: Not on file  . Alcohol Use: No     Comment: occasionally     Review of Systems Constitutional: No fever/chills Eyes: No visual changes. ENT: sore throat. Cardiovascular:  chest pain. Respiratory:  shortness of breath. Gastrointestinal: No abdominal pain.  No nausea, no vomiting.   Genitourinary: Negative for dysuria. Musculoskeletal: Negative for back pain. Skin: Negative for rash. Neurological:  headaches Hematological/Lymphatic:Foot swelling  10-point ROS otherwise negative.  ____________________________________________   PHYSICAL EXAM:  VITAL SIGNS: ED Triage Vitals  Enc Vitals Group     BP 10/02/14 0409 127/72 mmHg     Pulse Rate 10/02/14 0409 94     Resp 10/02/14 0409 20     Temp 10/02/14 0409 98.4 F (36.9 C)  Temp Source 10/02/14 0409 Oral     SpO2 10/02/14 0409 100 %     Weight --      Height --      Head Cir --      Peak Flow --      Pain Score 10/02/14 0316 7     Pain Loc --      Pain Edu? --      Excl. in [place]? --     Constitutional: Alert and oriented. Well appearing and in moderate distress from coughing Eyes: Conjunctivae are normal. PERRL. EOMI. Head: Atraumatic. Nose: No congestion/rhinnorhea. Mouth/Throat: Mucous membranes are moist.  Oropharynx non-erythematous. Cardiovascular: Normal rate, regular rhythm. Grossly normal heart sounds.  Good peripheral circulation. Respiratory: Dry cough with Normal respiratory effort.  Decreased air movement. Gastrointestinal: Soft and nontender. No distention. Positive bowel sounds Genitourinary:  Deferred Musculoskeletal: No lower extremity tenderness nor edema.   Neurologic:  Normal speech and language. No gross focal neurologic deficits are appreciated.  Skin:  Skin is warm, dry and intact. No rash noted. Psychiatric: Mood and affect are normal.   ____________________________________________   LABS (all labs ordered are listed, but only abnormal results are displayed)  Labs Reviewed - No data to display ____________________________________________  EKG  None ____________________________________________  RADIOLOGY  Chest x-ray: No active cardiopulmonary disease, allowing for the degree of inspiration no change from recent priors. ____________________________________________   PROCEDURES  Procedure(s) performed: None  Critical Care performed: No  ____________________________________________   INITIAL IMPRESSION / ASSESSMENT AND PLAN / ED COURSE  Pertinent labs & imaging results that were available during my care of the patient were reviewed by me and considered in my medical decision making (see chart for details).  This is a 34 year old female who comes in with a cough for 3 weeks. The patient has only been taking Benadryl and has not tried any other cough medicines. I will give the patient to duo nebs as well as prednisone and benzo no irritate as she does have some decreased air movement with a concern for possible bronchitis. The patient does not have a pneumonia on x-ray. A shunt when she's received the medications.  The patient reports that she still has the cough after the breathing treatments but her air movement is improved. I will discharge the patient home with bronchitis and give her some steroids as well as medications for home. We'll also give her an inhaler for possible shortness of breath. The patient will follow up with her primary care physician. ____________________________________________   FINAL CLINICAL IMPRESSION(S) / ED DIAGNOSES  Final  diagnoses:  Bronchitis      Danielle ApleyAllison P Tenia Goh, MD 10/02/14 [phone]

## 2014-10-02 NOTE — Discharge Instructions (Signed)
Cough, Adult ° A cough is a reflex that helps clear your throat and airways. It can help heal the body or may be a reaction to an irritated airway. A cough may only last 2 or 3 weeks (acute) or may last more than 8 weeks (chronic).  °CAUSES °Acute cough: °· Viral or bacterial infections. °Chronic cough: °· Infections. °· Allergies. °· Asthma. °· Post-nasal drip. °· Smoking. °· Heartburn or acid reflux. °· Some medicines. °· Chronic lung problems (COPD). °· Cancer. °SYMPTOMS  °· Cough. °· Fever. °· Chest pain. °· Increased breathing rate. °· High-pitched whistling sound when breathing (wheezing). °· Colored mucus that you cough up (sputum). °TREATMENT  °· A bacterial cough may be treated with antibiotic medicine. °· A viral cough must run its course and will not respond to antibiotics. °· Your caregiver may recommend other treatments if you have a chronic cough. °HOME CARE INSTRUCTIONS  °· Only take over-the-counter or prescription medicines for pain, discomfort, or fever as directed by your caregiver. Use cough suppressants only as directed by your caregiver. °· Use a cold steam vaporizer or humidifier in your bedroom or home to help loosen secretions. °· Sleep in a semi-upright position if your cough is worse at night. °· Rest as needed. °· Stop smoking if you smoke. °SEEK IMMEDIATE MEDICAL CARE IF:  °· You have pus in your sputum. °· Your cough starts to worsen. °· You cannot control your cough with suppressants and are losing sleep. °· You begin coughing up blood. °· You have difficulty breathing. °· You develop pain which is getting worse or is uncontrolled with medicine. °· You have a fever. °MAKE SURE YOU:  °· Understand these instructions. °· Will watch your condition. °· Will get help right away if you are not doing well or get worse. °Document Released: 10/09/2010 Document Revised: 07/05/2011 Document Reviewed: 10/09/2010 °ExitCare® Patient Information ©2015 [employer]. This information is not intended  to replace advice given to you by your health care provider. Make sure you discuss any questions you have with your health care provider. ° °Upper Respiratory Infection, Adult °An upper respiratory infection (URI) is also known as the common cold. It is often caused by a type of germ (virus). Colds are easily spread (contagious). You can pass it to others by kissing, coughing, sneezing, or drinking out of the same glass. Usually, you get better in 1 or 2 weeks.  °HOME CARE  °· Only take medicine as told by your doctor. °· Use a warm mist humidifier or breathe in steam from a hot shower. °· Drink enough water and fluids to keep your pee (urine) clear or pale yellow. °· Get plenty of rest. °· Return to work when your temperature is back to normal or as told by your doctor. You may use a face mask and wash your hands to stop your cold from spreading. °GET HELP RIGHT AWAY IF:  °· After the first few days, you feel you are getting worse. °· You have questions about your medicine. °· You have chills, shortness of breath, or brown or red spit (mucus). °· You have yellow or brown snot (nasal discharge) or pain in the face, especially when you bend forward. °· You have a fever, puffy (swollen) neck, pain when you swallow, or white spots in the back of your throat. °· You have a bad headache, ear pain, sinus pain, or chest pain. °· You have a high-pitched whistling sound when you breathe in and out (wheezing). °· You   have a lasting cough or cough up blood. °· You have sore muscles or a stiff neck. °MAKE SURE YOU:  °· Understand these instructions. °· Will watch your condition. °· Will get help right away if you are not doing well or get worse. °Document Released: 09/29/2007 Document Revised: 07/05/2011 Document Reviewed: 07/18/2013 °ExitCare® Patient Information ©2015 [employer]. This information is not intended to replace advice given to you by your health care provider. Make sure you discuss any questions you have with  your health care provider. ° °

## 2014-10-24 IMAGING — CT CT HEAD WITHOUT CONTRAST
1 series · 16 of 29 positions shown, 20 images · non-contrast
Comparison: none

REASON FOR EXAM: headache
COMMENTS:   May transport without cardiac monitor

[Series 2: soft tissue · axial · 0.42mm/px · z∈[-200,-70]mm · 16 of 29 slices shown, 20 images]
[im 2/29  brain]
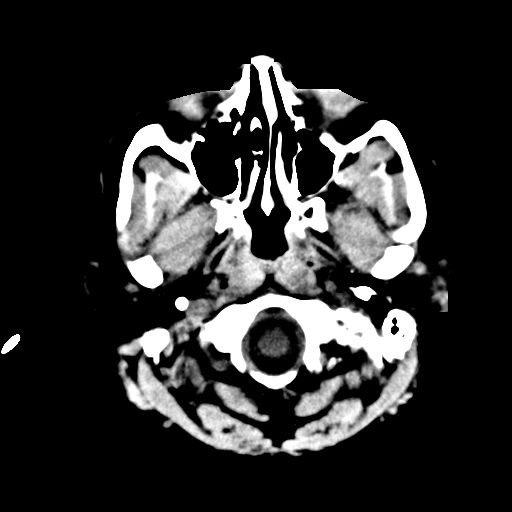
[im 2/29  bone]
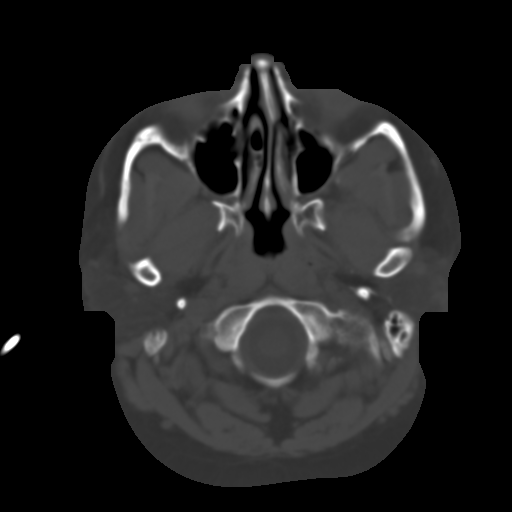
[im 4/29  brain]
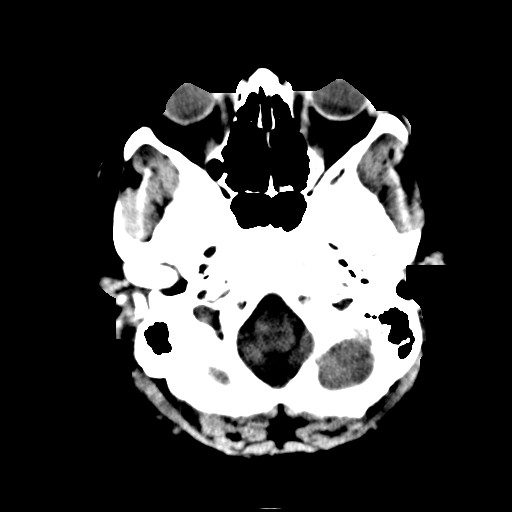
[im 6/29  brain]
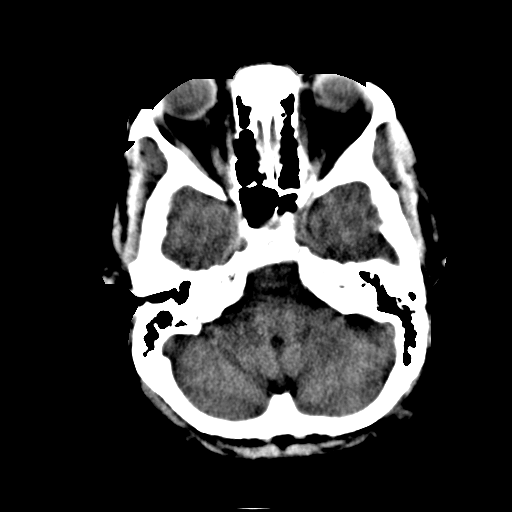
[im 7/29  brain]
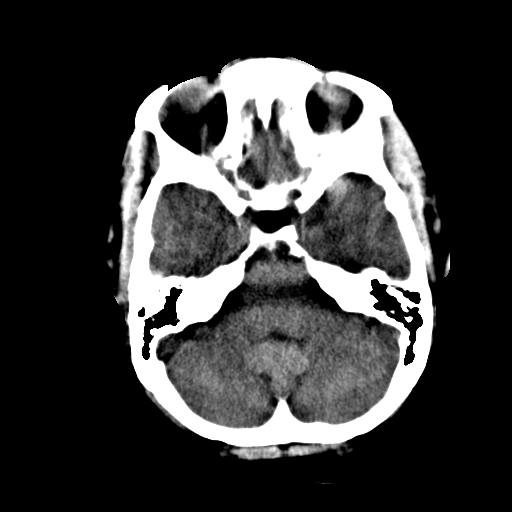
[im 9/29  brain]
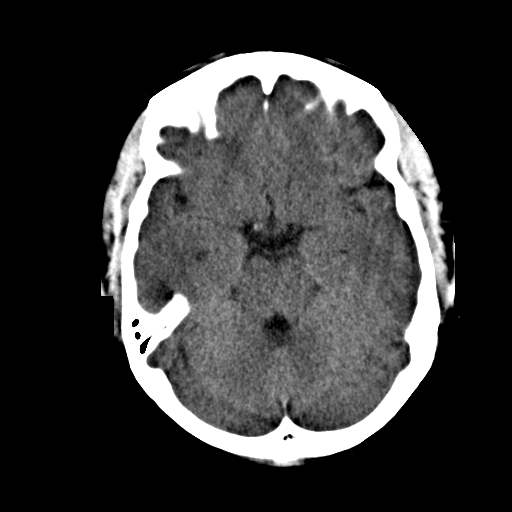
[im 9/29  bone]
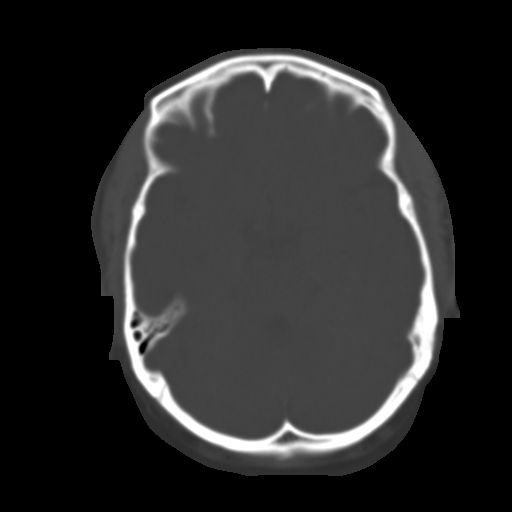
[im 11/29  brain]
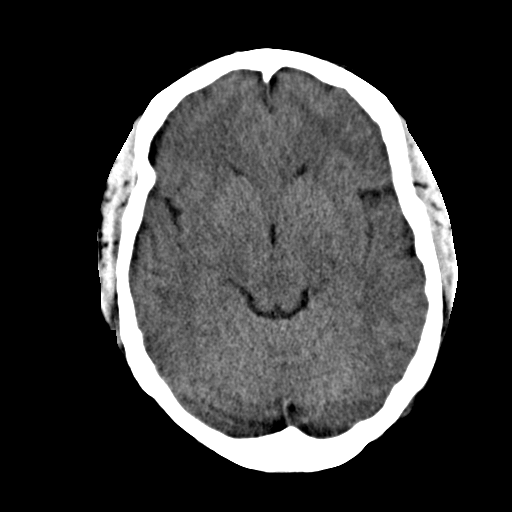
[im 12/29  brain]
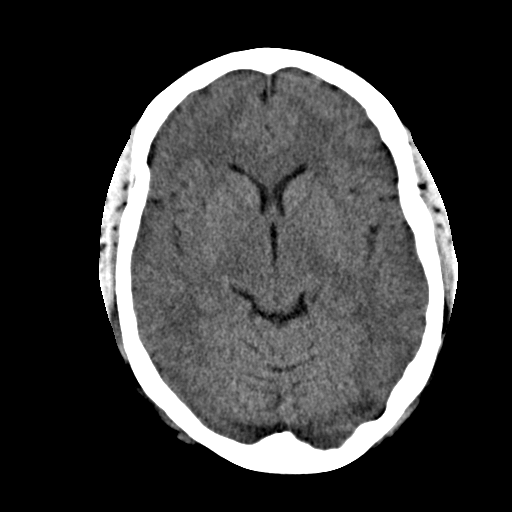
[im 14/29  brain]
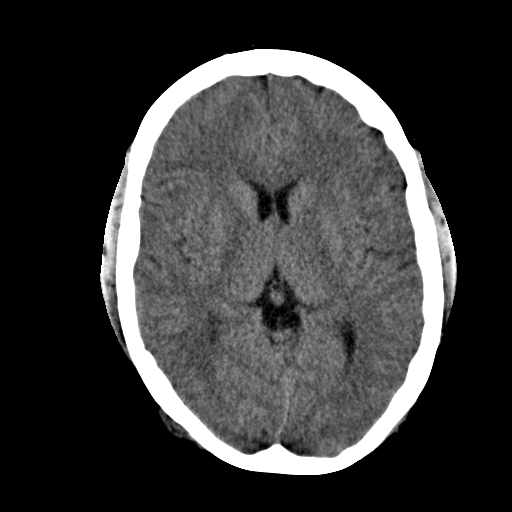
[im 16/29  brain]
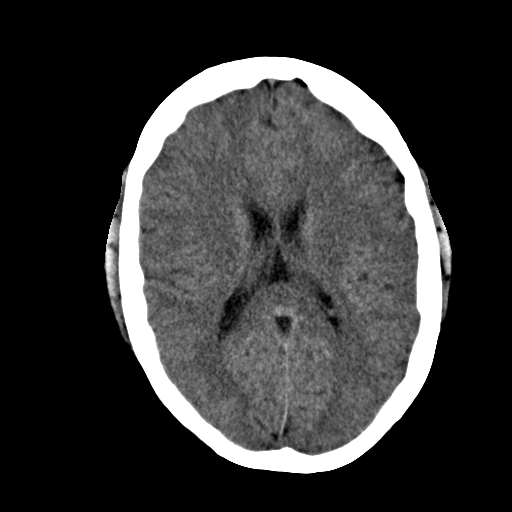
[im 16/29  bone]
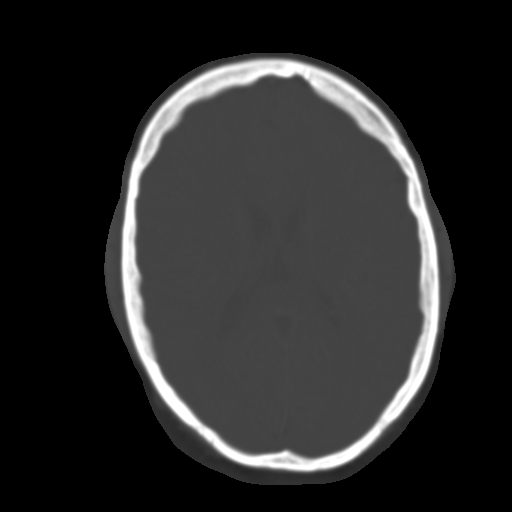
[im 18/29  brain]
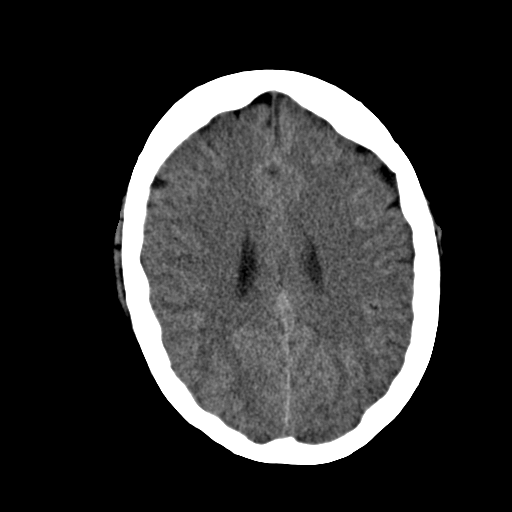
[im 19/29  brain]
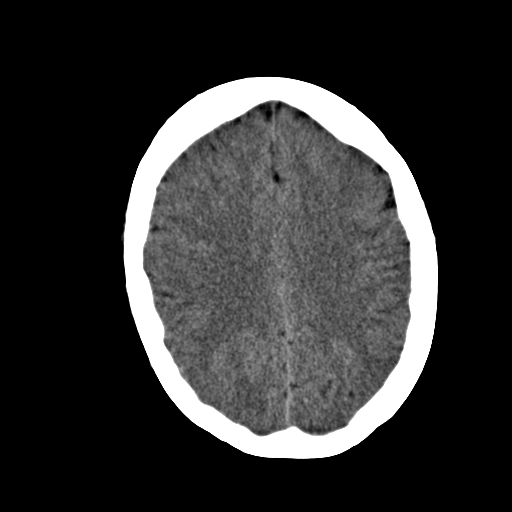
[im 21/29  brain]
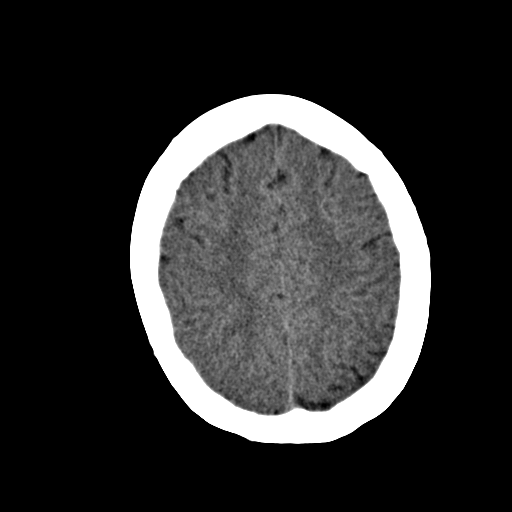
[im 23/29  brain]
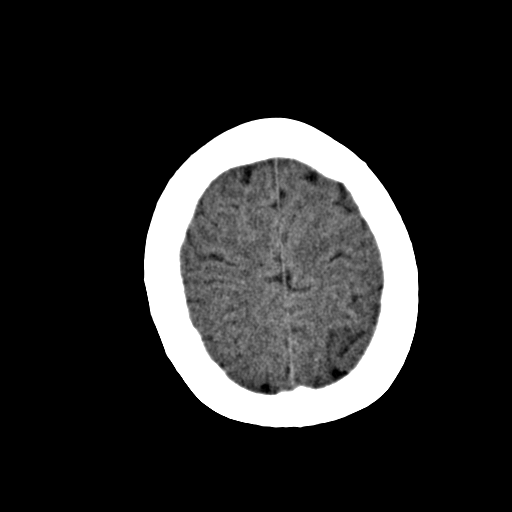
[im 23/29  bone]
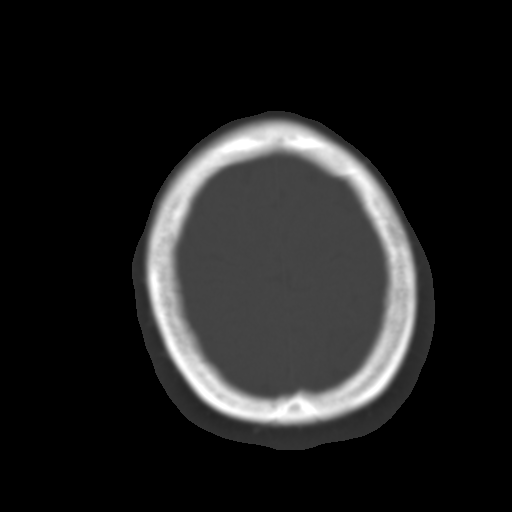
[im 24/29  brain]
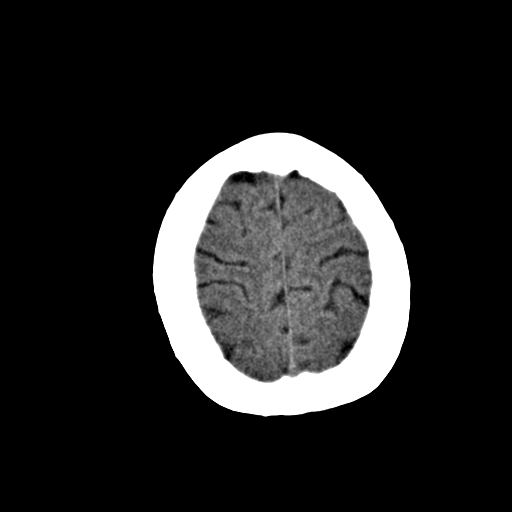
[im 26/29  brain]
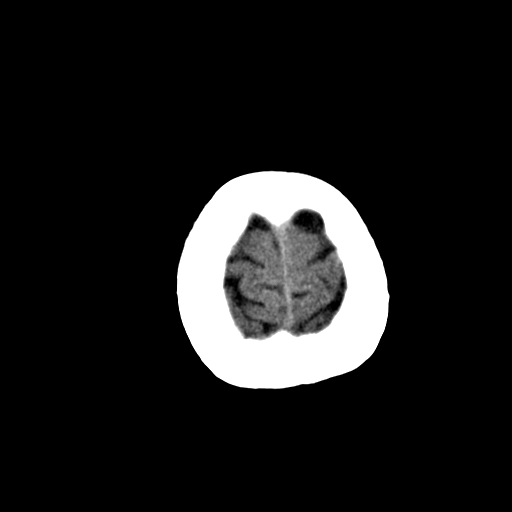
[im 28/29  brain]
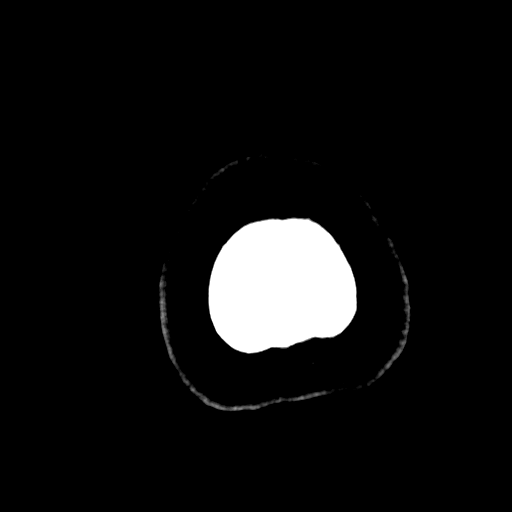

[16 of 29 positions shown; findings below may reference images not displayed]

PROCEDURE:     CT  - CT HEAD WITHOUT CONTRAST  - May 23, 2012  [DATE]

RESULT:     Axial noncontrast CT scanning was performed through the brain
with reconstructions at 5 mm intervals and slice thicknesses. Comparison is
made to the study March 07, 2011.

The ventricles are normal in size and position. There is no intracranial
hemorrhage nor intracranial mass effect. The cerebellum and brainstem are
normal in density. There is no evidence of an evolving ischemic infarction.
At bone window settings the observed portions of the paranasal sinuses and
mastoid air cells are clear.
IMPRESSION: There is no acute intracranial abnormality.

A preliminary report was sent to the [HOSPITAL] the conclusion
of the study.

[REDACTED]

## 2015-12-13 IMAGING — US ABDOMEN ULTRASOUND LIMITED
1 series · 14 of 25 positions shown · non-contrast
Comparison: Prior CT from 03/03/2013

CLINICAL DATA: Right upper quadrant, epigastric pain

EXAM:
US ABDOMEN LIMITED - RIGHT UPPER QUADRANT

[Series 1: abdomen ultrasound limited · 0.30mm/px · 14 of 35 slices shown]
[im 1/35]
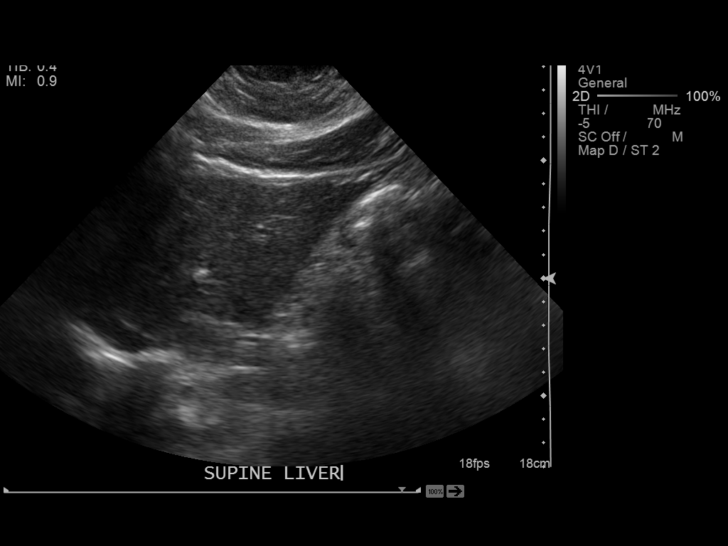
[im 3/35]
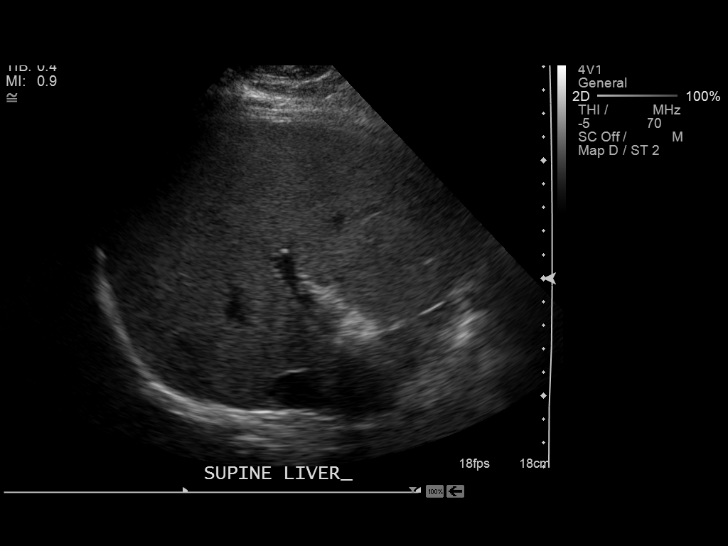
[im 6/35]
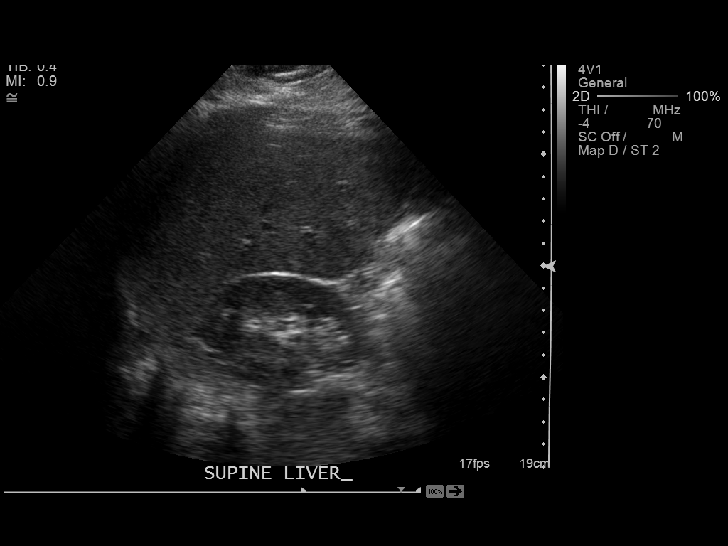
[im 9/35]
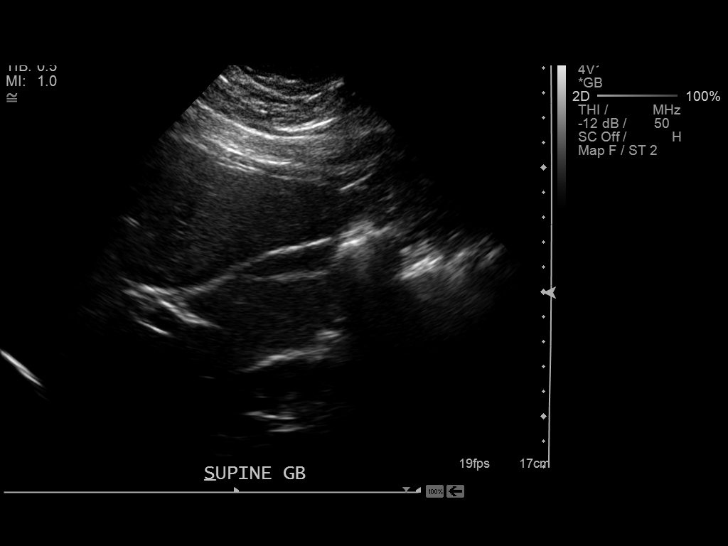
[im 12/35]
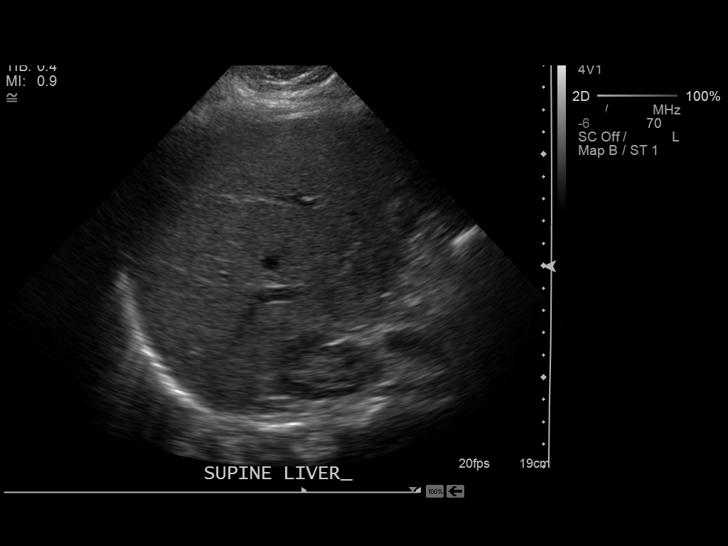
[im 13/35]
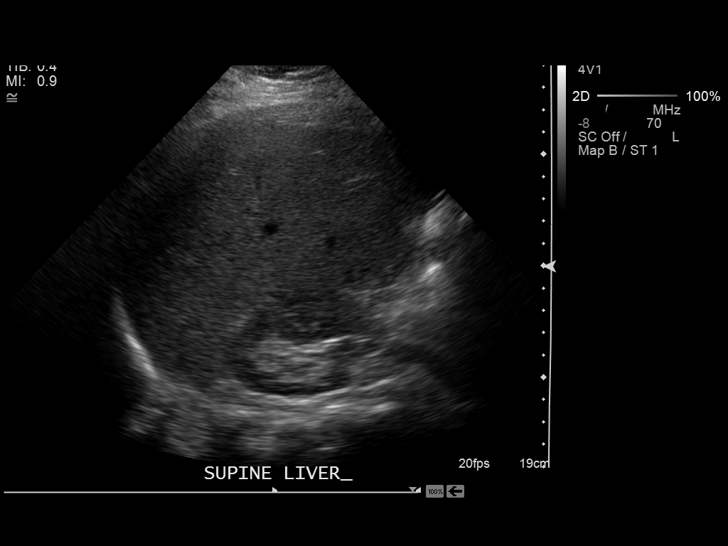
[im 16/35]
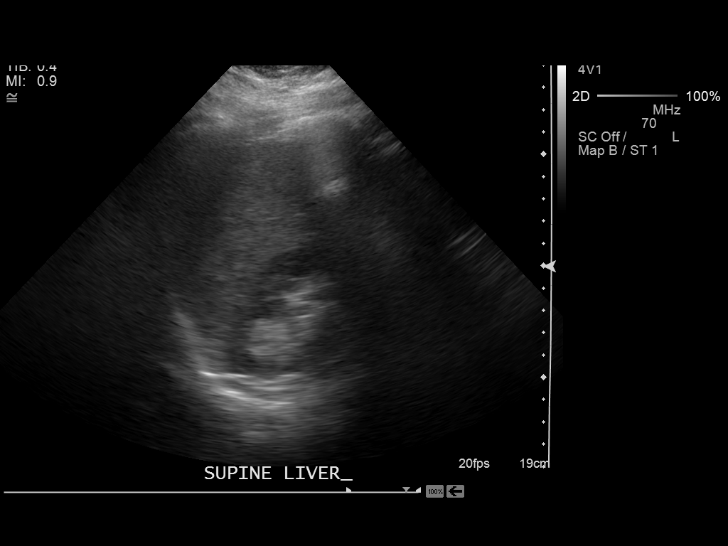
[im 19/35]
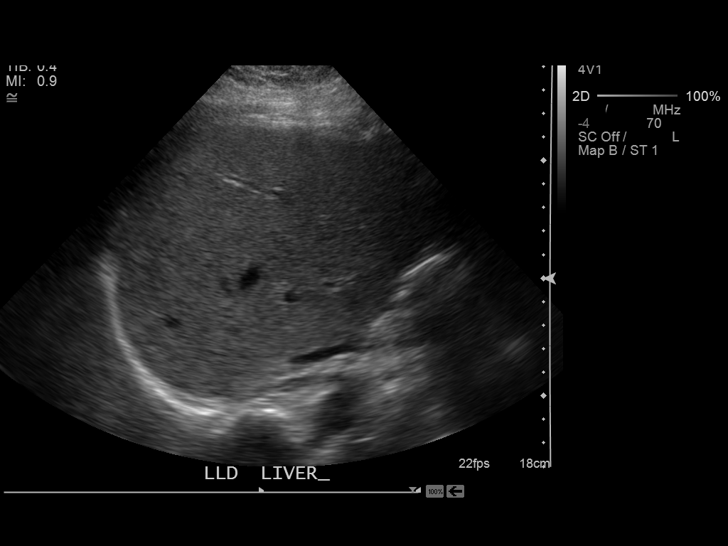
[im 22/35]
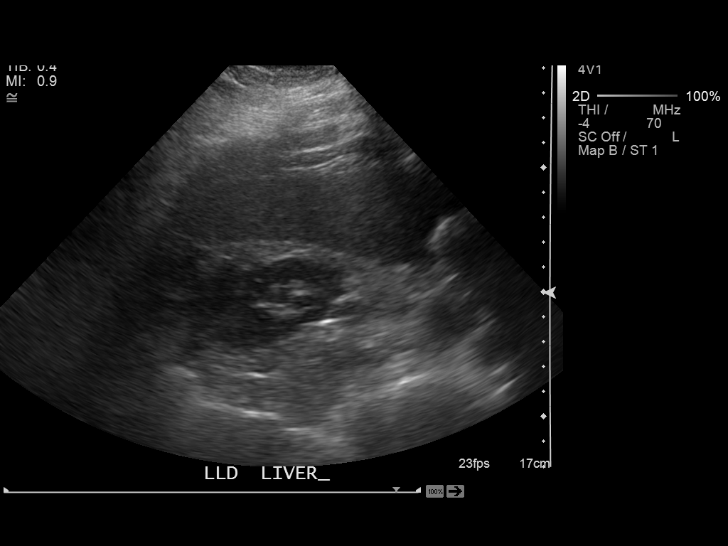
[im 23/35]
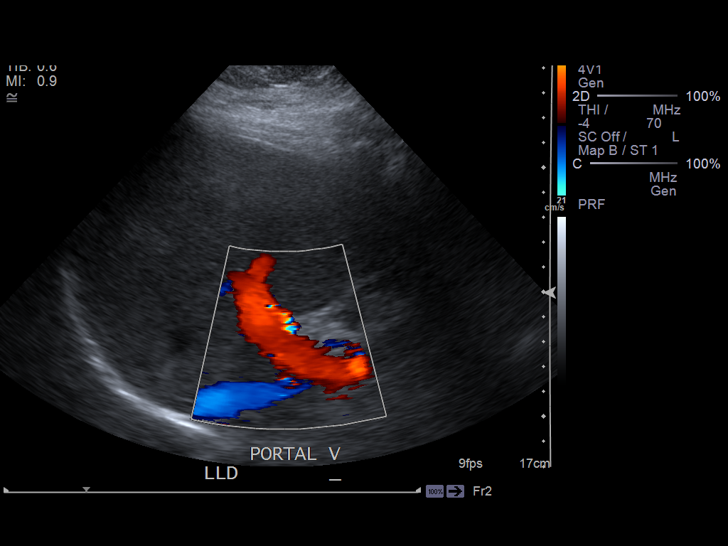
[im 26/35]
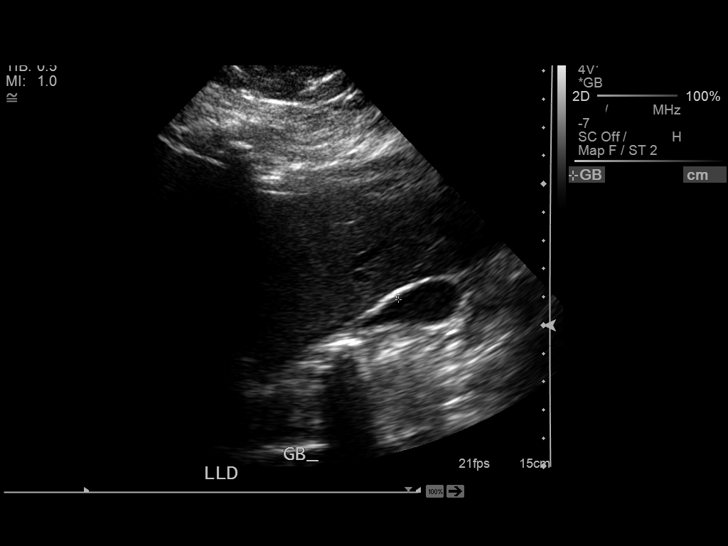
[im 29/35]
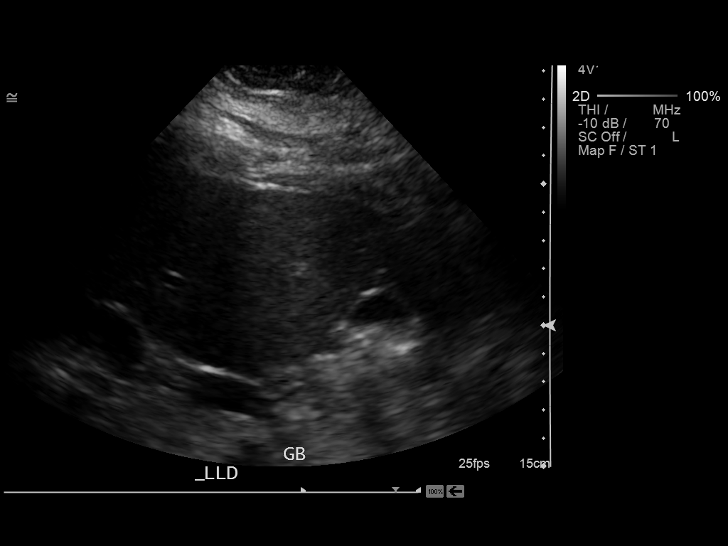
[im 32/35]
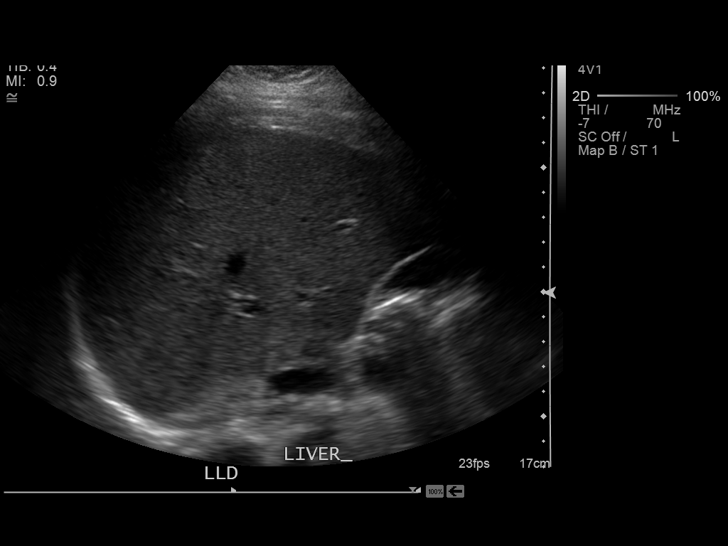
[im 35/35]
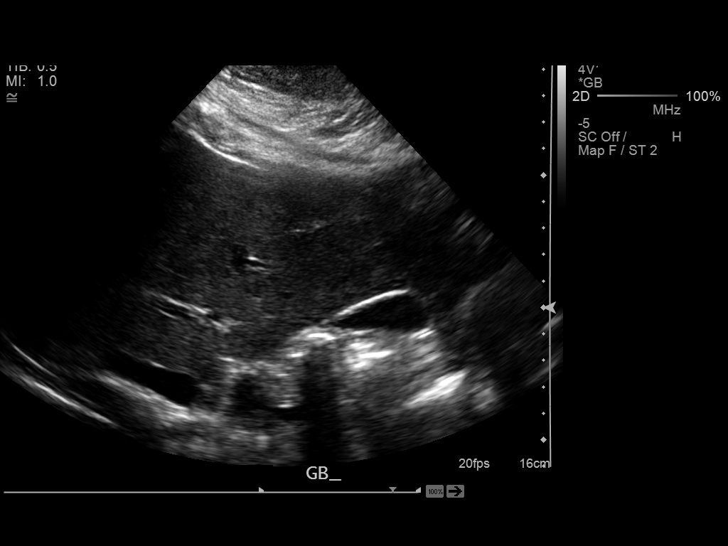

[14 of 25 positions shown; findings below may reference images not displayed]

FINDINGS: Gallbladder:

Gallbladder was slightly contracted. No gallstones or wall
thickening visualized. Gallbladder wall measured 1.9 mm. No
sonographic Murphy sign noted.

Common bile duct:

Diameter:

Liver:

No focal lesion identified. Within normal limits in parenchymal
echogenicity.
IMPRESSION: Normal right upper quadrant ultrasound with no sonographic evidence
of cholelithiasis, acute cholecystitis, or biliary ductal
dilatation.

## 2018-08-07 ENCOUNTER — Other Ambulatory Visit: Payer: Self-pay

## 2018-08-07 ENCOUNTER — Other Ambulatory Visit: Payer: Self-pay | Admitting: Family Medicine

## 2018-08-07 ENCOUNTER — Ambulatory Visit
Admission: RE | Admit: 2018-08-07 | Discharge: 2018-08-07 | Disposition: A | Discharge status: Home / Self Care | Payer: Self-pay | Source: Ambulatory Visit | Attending: Family Medicine | Admitting: Family Medicine

## 2018-08-07 ENCOUNTER — Other Ambulatory Visit (HOSPITAL_COMMUNITY): Payer: Self-pay | Admitting: Family Medicine

## 2018-08-07 DIAGNOSIS — M7989 Other specified soft tissue disorders: Secondary | ICD-10-CM | POA: Insufficient documentation

## 2019-01-11 DIAGNOSIS — D582 Other hemoglobinopathies: Secondary | ICD-10-CM | POA: Insufficient documentation

## 2019-01-25 DIAGNOSIS — Z803 Family history of malignant neoplasm of breast: Secondary | ICD-10-CM | POA: Insufficient documentation

## 2019-04-10 ENCOUNTER — Other Ambulatory Visit: Payer: Self-pay

## 2019-04-10 ENCOUNTER — Emergency Department
Admission: EM | Admit: 2019-04-10 | Discharge: 2019-04-10 | Disposition: A | Discharge status: Home / Self Care | Payer: Self-pay | Attending: Emergency Medicine | Admitting: Emergency Medicine

## 2019-04-10 ENCOUNTER — Encounter: Payer: Self-pay | Admitting: Emergency Medicine

## 2019-04-10 ENCOUNTER — Emergency Department: Payer: Self-pay

## 2019-04-10 DIAGNOSIS — Z20822 Contact with and (suspected) exposure to covid-19: Secondary | ICD-10-CM

## 2019-04-10 DIAGNOSIS — N3000 Acute cystitis without hematuria: Secondary | ICD-10-CM

## 2019-04-10 DIAGNOSIS — Z1152 Encounter for screening for COVID-19: Secondary | ICD-10-CM

## 2019-04-10 DIAGNOSIS — D509 Iron deficiency anemia, unspecified: Secondary | ICD-10-CM

## 2019-04-10 DIAGNOSIS — K8020 Calculus of gallbladder without cholecystitis without obstruction: Secondary | ICD-10-CM

## 2019-04-10 DIAGNOSIS — U071 COVID-19: Secondary | ICD-10-CM | POA: Insufficient documentation

## 2019-04-10 LAB — CBC
HCT: 31.5 % — ABNORMAL LOW (ref 36.0–46.0)
Hemoglobin: 11.3 g/dL — ABNORMAL LOW (ref 12.0–15.0)
MCH: 24.7 pg — ABNORMAL LOW (ref 26.0–34.0)
MCHC: 35.9 g/dL (ref 30.0–36.0)
MCV: 68.9 fL — ABNORMAL LOW (ref 80.0–100.0)
Platelets: 179 10*3/uL (ref 150–400)
RBC: 4.57 MIL/uL (ref 3.87–5.11)
RDW: 16.6 % — ABNORMAL HIGH (ref 11.5–15.5)
WBC: 5.2 10*3/uL (ref 4.0–10.5)
nRBC: 0 % (ref 0.0–0.2)

## 2019-04-10 LAB — URINALYSIS, COMPLETE (UACMP) WITH MICROSCOPIC
Bilirubin Urine: NEGATIVE
Glucose, UA: NEGATIVE mg/dL
Hgb urine dipstick: NEGATIVE
Ketones, ur: NEGATIVE mg/dL
Leukocytes,Ua: NEGATIVE
Nitrite: NEGATIVE
Protein, ur: NEGATIVE mg/dL
Specific Gravity, Urine: 1.024 (ref 1.005–1.030)
pH: 5 (ref 5.0–8.0)

## 2019-04-10 LAB — COMPREHENSIVE METABOLIC PANEL
ALT: 28 U/L (ref 0–44)
AST: 22 U/L (ref 15–41)
Albumin: 3.6 g/dL (ref 3.5–5.0)
Alkaline Phosphatase: 50 U/L (ref 38–126)
Anion gap: 6 (ref 5–15)
BUN: 10 mg/dL (ref 6–20)
CO2: 25 mmol/L (ref 22–32)
Calcium: 8.4 mg/dL — ABNORMAL LOW (ref 8.9–10.3)
Chloride: 105 mmol/L (ref 98–111)
Creatinine, Ser: 0.78 mg/dL (ref 0.44–1.00)
GFR calc Af Amer: >60 mL/min (ref >60)
GFR calc non Af Amer: >60 mL/min (ref >60)
Glucose, Bld: 97 mg/dL (ref 70–99)
Potassium: 3.6 mmol/L (ref 3.5–5.1)
Sodium: 136 mmol/L (ref 135–145)
Total Bilirubin: 0.7 mg/dL (ref 0.3–1.2)
Total Protein: 7.2 g/dL (ref 6.5–8.1)

## 2019-04-10 LAB — WET PREP, GENITAL
Clue Cells Wet Prep HPF POC: NONE SEEN
Sperm: NONE SEEN
Trich, Wet Prep: NONE SEEN
Yeast Wet Prep HPF POC: NONE SEEN

## 2019-04-10 LAB — LIPASE, BLOOD: Lipase: 19 U/L (ref 11–51)

## 2019-04-10 LAB — POCT PREGNANCY, URINE: Preg Test, Ur: NEGATIVE

## 2019-04-10 MED ORDER — KETOROLAC TROMETHAMINE 30 MG/ML IJ SOLN
30.0000 mg | Freq: Once | INTRAMUSCULAR | Status: AC
  Administered 2019-04-10: 30 mg via INTRAMUSCULAR
  Filled 2019-04-10: qty 1

## 2019-04-10 MED ORDER — CEPHALEXIN 500 MG PO CAPS: 500.0000 mg | ORAL_CAPSULE | Freq: Three times a day (TID) | ORAL | 0 refills | Status: AC

## 2019-04-10 NOTE — ED Triage Notes (Signed)
Pt reports low back pain and some vaginal burning for the past couple of days.

## 2019-04-10 NOTE — ED Notes (Signed)
See triage note  Presents with lower back pain and some vaginal itching  sxs' started couple of days ago

## 2019-04-10 NOTE — ED Provider Notes (Signed)
[facility] Emergency Department Provider Note  ____________________________________________  Time seen: Approximately 11:11 AM  I have reviewed the triage vital signs and the nursing notes.   HISTORY  Chief Complaint Back Pain and vaginal burning    HPI Danielle Pineda is a 38 y.o. female that presents to the emergency department for evaluation of low back pain and dysuria for several days.  Her vagina does burn, but only when she urinates.  Patient did have some mild abdominal soreness but it "is not bad."  Soreness is more to the right side, not to the front of her stomach.  She feels like she has a urinary tract infection.  No concerns for STDs.  No fever, nausea, vomiting, vaginal discharge.  Past Medical History:  Diagnosis Date  . Sickle cell trait ([place])     There are no problems to display for this patient.   History reviewed. No pertinent surgical history.  Prior to Admission medications   Medication Sig Start Date End Date Taking? Authorizing Provider  albuterol (PROVENTIL HFA;VENTOLIN HFA) 108 (90 BASE) MCG/ACT inhaler Inhale 2 puffs into the lungs every 6 (six) hours as needed for wheezing or shortness of breath. 10/02/14   Loney Hering, MD  cephALEXin (KEFLEX) 500 MG capsule Take 1 capsule (500 mg total) by mouth 3 (three) times daily for 7 days. 04/10/19 04/17/19  Laban Emperor, PA-C    Allergies Patient has no known allergies.  No family history on file.  Social History Social History   Tobacco Use  . Smoking status: Never Smoker  Substance Use Topics  . Alcohol use: No    Comment: occasionally   . Drug use: No     Review of Systems  Constitutional: No fever/chills Cardiovascular: No chest pain. Respiratory: No cough. No SOB. Gastrointestinal: No significant abdominal pain.  No nausea, no vomiting.  Genitourinary: Positive for dysuria. Musculoskeletal: Positive for low back pain. Skin: Negative for rash, abrasions,  lacerations, ecchymosis. Neurological: Negative for headaches   ____________________________________________   PHYSICAL EXAM:  VITAL SIGNS: ED Triage Vitals  Enc Vitals Group     BP 04/10/19 1058 138/90     Pulse Rate 04/10/19 1058 91     Resp 04/10/19 1058 18     Temp 04/10/19 1058 98.4 F (36.9 C)     Temp Source 04/10/19 1058 Oral     SpO2 04/10/19 1058 96 %     Weight 04/10/19 1022 (!) 350 lb (158.8 kg)     Height 04/10/19 1022 5\' 4"  (1.626 m)     Head Circumference --      Peak Flow --      Pain Score 04/10/19 1022 10     Pain Loc --      Pain Edu? --      Excl. in [place]? --      Constitutional: Alert and oriented. Well appearing and in no acute distress. Eyes: Conjunctivae are normal. PERRL. EOMI. Head: Atraumatic. ENT:      Ears:      Nose: No congestion/rhinnorhea.      Mouth/Throat: Mucous membranes are moist.  Neck: No stridor.  Cardiovascular: Normal rate, regular rhythm.  Good peripheral circulation. Respiratory: Normal respiratory effort without tachypnea or retractions. Lungs CTAB. Good air entry to the bases with no decreased or absent breath sounds. Gastrointestinal: Bowel sounds 4 quadrants. Soft and nontender to palpation. No guarding or rigidity. No palpable masses. No distention.  Pelvic: No external rashes or lesions seen.  Thin light yellow vaginal discharge. No cervical motion tenderness. Musculoskeletal: Full range of motion to all extremities. No gross deformities appreciated. Neurologic:  Normal speech and language. No gross focal neurologic deficits are appreciated.  Skin:  Skin is warm, dry and intact. No rash noted. Psychiatric: Mood and affect are normal. Speech and behavior are normal. Patient exhibits appropriate insight and judgement.   ____________________________________________   LABS (all labs ordered are listed, but only abnormal results are displayed)  Labs Reviewed  WET PREP, GENITAL - Abnormal; Notable for the following  components:      Result Value   WBC, Wet Prep HPF POC MODERATE (*)    All other components within normal limits  URINALYSIS, COMPLETE (UACMP) WITH MICROSCOPIC - Abnormal; Notable for the following components:   Color, Urine YELLOW (*)    APPearance HAZY (*)    Bacteria, UA RARE (*)    All other components within normal limits  CBC - Abnormal; Notable for the following components:   Hemoglobin 11.3 (*)    HCT 31.5 (*)    MCV 68.9 (*)    MCH 24.7 (*)    RDW 16.6 (*)    All other components within normal limits  COMPREHENSIVE METABOLIC PANEL - Abnormal; Notable for the following components:   Calcium 8.4 (*)    All other components within normal limits  GC/CHLAMYDIA PROBE AMP  SARS CORONAVIRUS 2 (TAT 6-24 HRS)  LIPASE, BLOOD  POC URINE PREG, ED  POCT PREGNANCY, URINE   ____________________________________________  EKG   ____________________________________________  RADIOLOGY  CT Renal Stone Study  Result Date: 04/10/2019 CLINICAL DATA:  Upper tract urinary stone. EXAM: CT ABDOMEN AND PELVIS WITHOUT CONTRAST TECHNIQUE: Multidetector CT imaging of the abdomen and pelvis was performed following the standard protocol without IV contrast. COMPARISON:  06/21/2014 FINDINGS: Lower chest: Patchy areas of ground-glass opacity are demonstrated at the right lung base in particular. No signs of effusion. No evidence of consolidation. Hepatobiliary: Lobular hepatic contours. No focal hepatic lesion. Stones layer in the gallbladder. Pancreas: Normal pancreas, no signs of focal lesion or peripancreatic inflammation. Spleen: Spleen is unremarkable. Adrenals/Urinary Tract: Adrenal glands are normal. Renal contours are smooth without signs of hydronephrosis. Stomach/Bowel: Normal caliber and contour of visualized bowel loops with normal appendix. No signs of perienteric or Peri colonic stranding. Vascular/Lymphatic: No signs of atherosclerotic change in the abdominal aorta or iliac vessels. No signs  of upper abdominal or retroperitoneal lymphadenopathy. Reproductive: Urinary bladder undo that uterus and adnexa, unremarkable on noncontrast CT. Other: No sign of free air or focal fluid. Musculoskeletal: No acute bone finding or destructive bone process. IMPRESSION: 1. Patchy areas of ground-glass opacity at the right lung base in particular, may represent an infectious or inflammatory process. Pattern could be seen in the setting of atypical or viral infection potentially even in the setting of COVID-19 infection. Clinical correlation may be helpful. 2. Cholelithiasis without CT evidence of acute cholecystitis. 3. Normal appendix. 4. Lobular hepatic contours, nonspecific, correlate with any clinical or laboratory evidence of liver disease. 5. These results were called by telephone at the time of interpretation on 04/10/2019 at 1:31 pm to provider [facility] , who verbally acknowledged these results. Electronically Signed   By: Donzetta Kohut M.D.   On: 04/10/2019 13:31    ____________________________________________    PROCEDURES  Procedure(s) performed:    Procedures    Medications  ketorolac (TORADOL) 30 MG/ML injection 30 mg (30 mg Intramuscular Given 04/10/19 1252)     ____________________________________________  INITIAL IMPRESSION / ASSESSMENT AND PLAN / ED COURSE  Pertinent labs & imaging results that were available during my care of the patient were reviewed by me and considered in my medical decision making (see chart for details).  Review of the [place] CSRS was performed in accordance of the [facility] prior to dispensing any controlled drugs.    Patient's diagnosis is consistent with urinary tract infection.  Vital signs and exam are reassuring. CBC shows show some mild anemia, which patient states that she takes iron pills for.   CMP largely unremarkable.   Urinalysis shows rare bacteria.  Wet prep shows moderate white blood cell count.  Patient denies any concern for STD.   On area and Chlamydia tests are pending.  CT renal stone study was ordered to further evaluate patient's low back discomfort.  Patient was given IM Toradol for pain.  CT scan concerning for possible viral URI, including COVID-19.  Patient denies any recent URI symptoms.  She denies any fever, nasal congestion, shortness of breath, chest pain, cough.  Covid test sent and is pending.  CT scan also consistent with gallstones and lobular hepatic contours.  Patient does state that she has had some intermittent right upper quadrant pain for months.  Abdomen is soft and nontender to palpation.  She denies any history of liver disease.  Liver function within normal limits.  Patient is given a referral to general surgery.  With patient will be discharged home with prescriptions for Keflex. Patient is to follow up with primary care and general surgery as directed. Patient is given ED precautions to return to the ED for any worsening or new symptoms.    Danielle Pineda was evaluated in Emergency Department on 04/10/2019 for the symptoms described in the history of present illness. She was evaluated in the context of the global COVID-19 pandemic, which necessitated consideration that the patient might be at risk for infection with the SARS-CoV-2 virus that causes COVID-19. Institutional protocols and algorithms that pertain to the evaluation of patients at risk for COVID-19 are in a state of rapid change based on information released by regulatory bodies including the CDC and federal and state organizations. These policies and algorithms were followed during the patient's care in the ED.   ____________________________________________  FINAL CLINICAL IMPRESSION(S) / ED DIAGNOSES  Final diagnoses:  Calculus of gallbladder without cholecystitis without obstruction  Acute cystitis without hematuria  Encounter for screening laboratory testing for COVID-19 virus  Iron deficiency anemia, unspecified iron deficiency  anemia type      NEW MEDICATIONS STARTED DURING THIS VISIT:  ED Discharge Orders         Ordered    cephALEXin (KEFLEX) 500 MG capsule  3 times daily     04/10/19 1605              This chart was dictated using voice recognition software/Dragon. Despite best efforts to proofread, errors can occur which can change the meaning. Any change was purely unintentional.    Enid DerryWagner, Rene [place], PA-C 04/10/19 1739    Dionne BucySiadecki, Sebastian, MD 04/14/19 [phone]

## 2019-04-11 ENCOUNTER — Telehealth: Payer: Self-pay | Admitting: *Deleted

## 2019-04-11 LAB — SARS CORONAVIRUS 2 (TAT 6-24 HRS): SARS Coronavirus 2: POSITIVE — AB

## 2019-04-11 NOTE — Telephone Encounter (Signed)
Pt called to get her covid 19 test results. She went to the hospital yesterday with lower back pain. covid test was done. Pt advised that her test result for covid is positive. She voiced understanding. Pt advised to quarantine for 10 to 14 days. May come out of quarantine if no fever without taking antipyretics (fever medicine) and symptoms have decreased at 10 days. Temperature should be within normal limits. Reviewed signs and symptoms of covid. She denies having symptoms. But made aware that body aches, cough are considered symptoms. Which is what she is experiencing. You should treat symptoms with OTC medications. You should also notify your PCP. Get fresh air, hydrated and get rest. Should be able to go outside your house, alone. Call 911 for respiratory distress (struggling to breath).  Remember to wear your mask, stay at home, social distant and clean the hard surfaces in your house. And all family members if they have not been tested, should be tested.  Advised of the new testing policy for her kids. Pt voiced understanding. Will notify the [facility].

## 2020-06-17 ENCOUNTER — Ambulatory Visit (INDEPENDENT_AMBULATORY_CARE_PROVIDER_SITE_OTHER): Payer: Self-pay

## 2020-06-17 ENCOUNTER — Other Ambulatory Visit: Payer: Self-pay

## 2020-06-17 ENCOUNTER — Ambulatory Visit
Admission: EM | Admit: 2020-06-17 | Discharge: 2020-06-17 | Disposition: A | Discharge status: Home / Self Care | Payer: Self-pay | Attending: Emergency Medicine | Admitting: Emergency Medicine

## 2020-06-17 DIAGNOSIS — M79672 Pain in left foot: Secondary | ICD-10-CM

## 2020-06-17 MED ORDER — IBUPROFEN 600 MG PO TABS: 600.0000 mg | ORAL_TABLET | Freq: Four times a day (QID) | ORAL | 0 refills | Status: DC | PRN

## 2020-06-17 NOTE — ED Provider Notes (Signed)
[facility]    CSN: [id number] Arrival date & time: 06/17/20  4097      History   Chief Complaint Chief Complaint  Patient presents with  . Foot Pain    HPI Danielle Pineda is a 40 y.o. female.   HPI   40 year old female here for evaluation of left foot pain.  Patient reports that she has been having pain in her left midfoot for 3 days.  She reports that she awoke in the middle the night with a throbbing pain.  She states that the top and medial side have been swollen.  Patient denies redness, heat, or known injury.  Patient does stand for long periods for her work.  Past Medical History:  Diagnosis Date  . Sickle cell trait (HCC)     There are no problems to display for this patient.   History reviewed. No pertinent surgical history.  OB History   No obstetric history on file.      Home Medications    Prior to Admission medications   Medication Sig Start Date End Date Taking? Authorizing Provider  ibuprofen (ADVIL) 600 MG tablet Take 1 tablet (600 mg total) by mouth every 6 (six) hours as needed. 06/17/20  Yes Becky Augusta, NP  albuterol (PROVENTIL HFA;VENTOLIN HFA) 108 (90 BASE) MCG/ACT inhaler Inhale 2 puffs into the lungs every 6 (six) hours as needed for wheezing or shortness of breath. 10/02/14   Rebecka Apley, MD    Family History History reviewed. No pertinent family history.  Social History Social History   Tobacco Use  . Smoking status: Never Smoker  . Smokeless tobacco: Never Used  Substance Use Topics  . Alcohol use: Yes    Comment: occasionally   . Drug use: No     Allergies   Patient has no known allergies.   Review of Systems Review of Systems  Musculoskeletal: Positive for arthralgias.  Skin: Negative for color change.  Neurological: Negative for weakness and numbness.  Hematological: Negative.   Psychiatric/Behavioral: Negative.      Physical Exam Triage Vital Signs ED Triage Vitals [06/17/20 0904]  Enc  Vitals Group     BP (!) 144/84     Pulse Rate 81     Resp 18     Temp 98.4 F (36.9 C)     Temp Source Oral     SpO2 100 %     Weight (!) 350 lb (158.8 kg)     Height 5\' 2"  (1.575 m)     Head Circumference      Peak Flow      Pain Score 10     Pain Loc      Pain Edu?      Excl. in GC?    No data found.  Updated Vital Signs BP (!) 144/84   Pulse 81   Temp 98.4 F (36.9 C) (Oral)   Resp 18   Ht 5\' 2"  (1.575 m)   Wt (!) 350 lb (158.8 kg)   SpO2 100%   BMI 64.02 kg/m   Visual Acuity Right Eye Distance:   Left Eye Distance:   Bilateral Distance:    Right Eye Near:   Left Eye Near:    Bilateral Near:     Physical Exam Vitals and nursing note reviewed.  Constitutional:      General: She is not in acute distress.    Appearance: Normal appearance. She is obese. She is not diaphoretic.  HENT:  Head: Normocephalic and atraumatic.  Cardiovascular:     Rate and Rhythm: Normal rate and regular rhythm.     Pulses: Normal pulses.     Heart sounds: Normal heart sounds. No murmur heard. No gallop.   Pulmonary:     Effort: Pulmonary effort is normal.     Breath sounds: Normal breath sounds. No wheezing, rhonchi or rales.  Musculoskeletal:        General: Swelling and tenderness present. No deformity or signs of injury.  Skin:    General: Skin is warm and dry.     Capillary Refill: Capillary refill takes less than 2 seconds.     Findings: No bruising or erythema.  Neurological:     General: No focal deficit present.     Mental Status: She is alert and oriented to person, place, and time.     Sensory: No sensory deficit.     Motor: No weakness.  Psychiatric:        Mood and Affect: Mood normal.        Behavior: Behavior normal.        Thought Content: Thought content normal.        Judgment: Judgment normal.      UC Treatments / Results  Labs (all labs ordered are listed, but only abnormal results are displayed) Labs Reviewed - No data to  display  EKG   Radiology DG Foot Complete Left  Result Date: 06/17/2020 CLINICAL DATA:  Pain and swelling EXAM: LEFT FOOT - COMPLETE 3+ VIEW COMPARISON:  None. FINDINGS: Frontal, oblique, and lateral views were obtained. No fracture or dislocation. Joint spaces appear normal. No erosive change. There are posterior and inferior calcaneal spurs. IMPRESSION: Calcaneal spurs. No appreciable joint space narrowing. No fracture or dislocation. Electronically Signed   By: Bretta Bang III M.D.   On: 06/17/2020 09:56    Procedures Procedures (including critical care time)  Medications Ordered in UC Medications - No data to display  Initial Impression / Assessment and Plan / UC Course  I have reviewed the triage vital signs and the nursing notes.  Pertinent labs & imaging results that were available during my care of the patient were reviewed by me and considered in my medical decision making (see chart for details).   Patient is a very pleasant, obese 40 year old female here for evaluation of 3 days of left foot pain.  Patient is complaining of pain in the dorsal midfoot overlying the navicular and cuboid.  There are some mild swelling to the area but no fluctuance, induration, erythema, or heat.  The area is mildly tender to palpation.  PT and DP pulses are 2+.  Patient has full sensation in her toes and normal range of motion.  Patient reports that she does have an increase in pain with resisted dorsiflexion but not so much with resisted plantarflexion.  Will obtain radiograph of left foot look for possible stress fracture.  The lack of erythema or warmth makes gout less likely despite the sudden onset in the middle the night presentation.  Radiology interpretation of left foot films is that there are anterior and posterior calcaneal spurs but no other abnormal findings.  Will discharge patient home with a diagnosis of left foot pain, will encourage patient to wear a supportive shoe, keep her  left foot elevated as much as possible, take ibuprofen to help with inflammation, and will provide work note.  Patient vies that if her symptoms do not improve she will need to see  orthopedics.  Final Clinical Impressions(s) / UC Diagnoses   Final diagnoses:  Foot pain, left     Discharge Instructions     Your x-rays did not show any broken bones or dislocation of bones.  The x-rays cannot show a soft tissue injury such as ligaments or tendons.  Take ibuprofen, 600 mg every 6 hours with food, as needed for pain and inflammation.  Keep your left foot elevated as much as possible to help decrease inflammation and aid in pain relief.  If your pain continues, or worsens, you need to follow-up with orthopedics.    ED Prescriptions    Medication Sig Dispense Auth. Provider   ibuprofen (ADVIL) 600 MG tablet Take 1 tablet (600 mg total) by mouth every 6 (six) hours as needed. 30 tablet Becky Augusta, NP     PDMP not reviewed this encounter.   Becky Augusta, NP 06/17/20 1004

## 2020-06-17 NOTE — Discharge Instructions (Signed)
Your x-rays did not show any broken bones or dislocation of bones.  The x-rays cannot show a soft tissue injury such as ligaments or tendons.  Take ibuprofen, 600 mg every 6 hours with food, as needed for pain and inflammation.  Keep your left foot elevated as much as possible to help decrease inflammation and aid in pain relief.  If your pain continues, or worsens, you need to follow-up with orthopedics.

## 2020-06-17 NOTE — ED Triage Notes (Signed)
Pt reports having L foot pain x3 days. No known injury to foot.

## 2022-01-12 ENCOUNTER — Ambulatory Visit
Admission: RE | Admit: 2022-01-12 | Discharge: 2022-01-12 | Disposition: A | Discharge status: Home / Self Care | Payer: PRIVATE HEALTH INSURANCE | Attending: Family Medicine | Admitting: Family Medicine

## 2022-01-12 ENCOUNTER — Other Ambulatory Visit: Payer: Self-pay | Admitting: Family Medicine

## 2022-01-12 ENCOUNTER — Ambulatory Visit
Admission: RE | Admit: 2022-01-12 | Discharge: 2022-01-12 | Disposition: A | Discharge status: Home / Self Care | Payer: PRIVATE HEALTH INSURANCE | Source: Ambulatory Visit | Attending: Family Medicine | Admitting: Family Medicine

## 2022-01-12 DIAGNOSIS — G8929 Other chronic pain: Secondary | ICD-10-CM

## 2022-01-12 DIAGNOSIS — M549 Dorsalgia, unspecified: Secondary | ICD-10-CM | POA: Insufficient documentation

## 2022-01-12 DIAGNOSIS — Z1231 Encounter for screening mammogram for malignant neoplasm of breast: Secondary | ICD-10-CM

## 2022-01-14 ENCOUNTER — Other Ambulatory Visit: Payer: Self-pay | Admitting: Family Medicine

## 2022-01-14 DIAGNOSIS — R945 Abnormal results of liver function studies: Secondary | ICD-10-CM

## 2022-01-25 ENCOUNTER — Ambulatory Visit
Admission: RE | Admit: 2022-01-25 | Discharge: 2022-01-25 | Disposition: A | Discharge status: Home / Self Care | Payer: PRIVATE HEALTH INSURANCE | Source: Ambulatory Visit | Attending: Family Medicine | Admitting: Family Medicine

## 2022-01-25 DIAGNOSIS — R945 Abnormal results of liver function studies: Secondary | ICD-10-CM | POA: Diagnosis present

## 2022-02-01 ENCOUNTER — Inpatient Hospital Stay: Admission: RE | Admit: 2022-02-01 | Payer: PRIVATE HEALTH INSURANCE | Source: Ambulatory Visit

## 2022-02-19 LAB — GC/CHLAMYDIA PROBE AMP
Chlamydia trachomatis, NAA: NEGATIVE
Neisseria Gonorrhoeae by PCR: NEGATIVE

## 2022-11-16 ENCOUNTER — Ambulatory Visit
Admission: EM | Admit: 2022-11-16 | Discharge: 2022-11-16 | Disposition: A | Discharge status: Home / Self Care | Payer: PRIVATE HEALTH INSURANCE | Attending: Emergency Medicine | Admitting: Emergency Medicine

## 2022-11-16 DIAGNOSIS — M7730 Calcaneal spur, unspecified foot: Secondary | ICD-10-CM

## 2022-11-16 DIAGNOSIS — R030 Elevated blood-pressure reading, without diagnosis of hypertension: Secondary | ICD-10-CM | POA: Diagnosis not present

## 2022-11-16 DIAGNOSIS — M79672 Pain in left foot: Secondary | ICD-10-CM

## 2022-11-16 DIAGNOSIS — M79671 Pain in right foot: Secondary | ICD-10-CM

## 2022-11-16 MED ORDER — DICLOFENAC SODIUM 1 % EX GEL: 2.0000 g | Freq: Three times a day (TID) | CUTANEOUS | 0 refills | Status: AC | PRN

## 2022-11-16 NOTE — ED Provider Notes (Signed)
[facility]    CSN: [id number] Arrival date & time: 11/16/22  1650      History   Chief Complaint No chief complaint on file.   HPI Danielle Pineda is a 42 y.o. female.   42 year old obese female, Danielle Pineda, presents to urgent care for evaluation of bilateral heel pain for 1 week.  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  Patient has not take anything for her discomfort requesting a work note.  Pt previously seen at this facility for heel pain, dx w heel spur,referred to Orthopedics,pt has not followed up.  The history is provided by the patient and a relative. No language interpreter was used.    Past Medical History:  Diagnosis Date   Sickle cell trait [facility])     Patient Active Problem List   Diagnosis Date Noted   Heel pain, bilateral 11/16/2022   Heel spur, unspecified laterality 11/16/2022   Elevated blood pressure reading 11/16/2022    History reviewed. No pertinent surgical history.  OB History   No obstetric history on file.      Home Medications    Prior to Admission medications   Medication Sig Start Date End Date Taking? Authorizing Provider  diclofenac Sodium (VOLTAREN ARTHRITIS PAIN) 1 % GEL Apply 2 g topically every 8 (eight) hours as needed for up to 3 days. 11/16/22 11/19/22 Yes Amelia Burgard, Para March, NP  albuterol (PROVENTIL HFA;VENTOLIN HFA) 108 (90 BASE) MCG/ACT inhaler Inhale 2 puffs into the lungs every 6 (six) hours as needed for wheezing or shortness of breath. 10/02/14   Rebecka Apley, MD    Family History No family history on file.  Social History Social History   Tobacco Use   Smoking status: Never   Smokeless tobacco: Never  Substance Use Topics   Alcohol use: Yes    Comment: occasionally    Drug use: No     Allergies   Patient has no known allergies.   Review of Systems Review of Systems  Constitutional:  Negative for fever.   Musculoskeletal:  Positive for arthralgias and gait problem.  All other systems reviewed and are negative.    Physical Exam Triage Vital Signs ED Triage Vitals  Encounter Vitals Group     BP      Systolic BP Percentile      Diastolic BP Percentile      Pulse      Resp      Temp      Temp src      SpO2      Weight      Height      Head Circumference      Peak Flow      Pain Score      Pain Loc      Pain Education      Exclude from Growth Chart    No data found.  Updated Vital Signs BP (!) 142/87 (BP Location: Left Wrist)   Pulse 79   Temp 98.6 F (37 C) (Oral)   LMP 11/02/2022 (Approximate)   SpO2 99%   Visual Acuity Right Eye Distance:   Left Eye Distance:   Bilateral Distance:    Right Eye Near:   Left Eye Near:    Bilateral Near:     Physical Exam Vitals and nursing note reviewed.  Skin:    Findings: No rash.  Neurological:  General: No focal deficit present.     Mental Status: She is alert and oriented to person, place, and time.     GCS: GCS eye subscore is 4. GCS verbal subscore is 5. GCS motor subscore is 6.  Psychiatric:        Attention and Perception: Attention normal.        Mood and Affect: Mood normal.        Speech: Speech normal.        Behavior: Behavior normal.      UC Treatments / Results  Labs (all labs ordered are listed, but only abnormal results are displayed) Labs Reviewed - No data to display  EKG   Radiology No results found.  Procedures Procedures (including critical care time)  Medications Ordered in UC Medications - No data to display  Initial Impression / Assessment and Plan / UC Course  I have reviewed the triage vital signs and the nursing notes.  Pertinent labs & imaging results that were available during my care of the patient were reviewed by me and considered in my medical decision making (see chart for details).     Ddx: Heel spur, heel pain, obesity, elevated blood pressure Final Clinical  Impressions(s) / UC Diagnoses   Final diagnoses:  Heel pain, bilateral  Heel spur, unspecified laterality  Elevated blood pressure reading     Discharge Instructions      These follow-up with [facility] orthopedist of your choice.  Use Voltaren as directed.  May try some sole inserts(orthotics), wear supportive shoes.  Follow-up with PCP as her blood pressure is elevated in the urgent care today.  Return as needed     ED Prescriptions     Medication Sig Dispense Auth. Provider   diclofenac Sodium (VOLTAREN ARTHRITIS PAIN) 1 % GEL Apply 2 g topically every 8 (eight) hours as needed for up to 3 days. 50 g Masami Plata, Para March, NP      PDMP not reviewed this encounter.   Clancy Gourd, NP 11/16/22 1753

## 2022-11-16 NOTE — ED Triage Notes (Signed)
Pt c/o bilateral heel pain onset x1 week ago, denies any injury.

## 2022-11-16 NOTE — Discharge Instructions (Addendum)
These follow-up with [facility] orthopedist of your choice.  Use Voltaren as directed.  May try some sole inserts(orthotics), wear supportive shoes.  Follow-up with PCP as her blood pressure is elevated in the urgent care today.  Return as needed

## 2022-12-06 ENCOUNTER — Ambulatory Visit
Admission: EM | Admit: 2022-12-06 | Discharge: 2022-12-06 | Disposition: A | Discharge status: Home / Self Care | Payer: PRIVATE HEALTH INSURANCE | Attending: Emergency Medicine | Admitting: Emergency Medicine

## 2022-12-06 DIAGNOSIS — Z3202 Encounter for pregnancy test, result negative: Secondary | ICD-10-CM | POA: Diagnosis not present

## 2022-12-06 LAB — PREGNANCY, URINE: Preg Test, Ur: NEGATIVE

## 2022-12-06 NOTE — ED Triage Notes (Signed)
Patient is here for pregnancy test. LMP end of June per patient.

## 2022-12-06 NOTE — Discharge Instructions (Addendum)
Declined avs 

## 2022-12-06 NOTE — ED Provider Notes (Signed)
[facility]    CSN: [id number] Arrival date & time: 12/06/22  1116      History   Chief Complaint Chief Complaint  Patient presents with   pregnacy test    HPI Danielle Pineda is a 42 y.o. female.   Patient presents for evaluation after missing menstruation for 2 months.  Last known menstruation on October 03, 2022.  Typically has monthly period.  Sexually active.  Has not completed any home testing.  No use of birth control or emergency contraceptive.      Past Medical History:  Diagnosis Date   Sickle cell trait [facility])     Patient Active Problem List   Diagnosis Date Noted   Heel pain, bilateral 11/16/2022   Heel spur, unspecified laterality 11/16/2022   Elevated blood pressure reading 11/16/2022    History reviewed. No pertinent surgical history.  OB History   No obstetric history on file.      Home Medications    Prior to Admission medications   Medication Sig Start Date End Date Taking? Authorizing Provider  albuterol (PROVENTIL HFA;VENTOLIN HFA) 108 (90 BASE) MCG/ACT inhaler Inhale 2 puffs into the lungs every 6 (six) hours as needed for wheezing or shortness of breath. 10/02/14   Rebecka Apley, MD    Family History History reviewed. No pertinent family history.  Social History Social History   Tobacco Use   Smoking status: Never   Smokeless tobacco: Never  Substance Use Topics   Alcohol use: Yes    Comment: occasionally    Drug use: No     Allergies   Patient has no known allergies.   Review of Systems Review of Systems   Physical Exam Triage Vital Signs ED Triage Vitals  Encounter Vitals Group     BP 12/06/22 1147 134/89     Systolic BP Percentile --      Diastolic BP Percentile --      Pulse Rate 12/06/22 1147 85     Resp 12/06/22 1147 18     Temp 12/06/22 1147 98.2 F (36.8 C)     Temp Source 12/06/22 1147 Oral     SpO2 12/06/22 1147 100 %     Weight 12/06/22 1145 293 lb (132.9 kg)     Height --      Head  Circumference --      Peak Flow --      Pain Score --      Pain Loc --      Pain Education --      Exclude from Growth Chart --    No data found.  Updated Vital Signs BP 134/89 (BP Location: Left Arm)   Pulse 85   Temp 98.2 F (36.8 C) (Oral)   Resp 18   Wt 293 lb (132.9 kg)   LMP 11/02/2022 (Approximate)   SpO2 100%   BMI 53.59 kg/m   Visual Acuity Right Eye Distance:   Left Eye Distance:   Bilateral Distance:    Right Eye Near:   Left Eye Near:    Bilateral Near:     Physical Exam Constitutional:      Appearance: Normal appearance.  Eyes:     Extraocular Movements: Extraocular movements intact.  Pulmonary:     Effort: Pulmonary effort is normal.  Neurological:     Mental Status: She is alert and oriented to person, place, and time. Mental status is at baseline.      UC Treatments / Results  Labs (  all labs ordered are listed, but only abnormal results are displayed) Labs Reviewed  PREGNANCY, URINE    EKG   Radiology No results found.  Procedures Procedures (including critical care time)  Medications Ordered in UC Medications - No data to display  Initial Impression / Assessment and Plan / UC Course  I have reviewed the triage vital signs and the nursing notes.  Pertinent labs & imaging results that were available during my care of the patient were reviewed by me and considered in my medical decision making (see chart for details).  Negative pregnancy test  Urine pregnancy negative, advised to monitor and if continued amenorrhea to use over-the-counter pregnancy test to monitor, as she is 42 years of age could be perimenopausal, advised to follow-up with her OB/GYN for further evaluation as needed Final Clinical Impressions(s) / UC Diagnoses   Final diagnoses:  None     Discharge Instructions      Pregnancy Test today is negative   ED Prescriptions   None    PDMP not reviewed this encounter.   Valinda Hoar, NP 12/06/22  1236

## 2023-01-06 ENCOUNTER — Ambulatory Visit (INDEPENDENT_AMBULATORY_CARE_PROVIDER_SITE_OTHER): Payer: PRIVATE HEALTH INSURANCE | Admitting: Podiatry

## 2023-01-06 ENCOUNTER — Encounter: Payer: Self-pay | Admitting: Podiatry

## 2023-01-06 DIAGNOSIS — M722 Plantar fascial fibromatosis: Secondary | ICD-10-CM | POA: Diagnosis not present

## 2023-01-06 NOTE — Progress Notes (Signed)
  Subjective:  Patient ID: Danielle Pineda, female    DOB: 24-Jan-1981,  MRN: [id number]  Chief Complaint  Patient presents with   Foot Pain    Bilateral heel pain    42 y.o. female presents with the above complaint.  Patient presents with bilateral heel pain that has been on for quite some time hurts with ambulation worse with pressure she states been present for a year.  She with taking for step in the morning.  She would like to discuss treatment options she has not seen anyone else prior to seeing me.   Review of Systems: Negative except as noted in the HPI. Denies N/V/F/Ch.  Past Medical History:  Diagnosis Date   Sickle cell trait (HCC)     Current Outpatient Medications:    albuterol (PROVENTIL HFA;VENTOLIN HFA) 108 (90 BASE) MCG/ACT inhaler, Inhale 2 puffs into the lungs every 6 (six) hours as needed for wheezing or shortness of breath., Disp: 1 Inhaler, Rfl: 0   ibuprofen (ADVIL) 800 MG tablet, Take by mouth., Disp: , Rfl:    Multiple Vitamin (MULTI-VITAMIN) tablet, Take 1 tablet by mouth daily., Disp: , Rfl:   Social History   Tobacco Use  Smoking Status Never  Smokeless Tobacco Never    No Known Allergies Objective:  There were no vitals filed for this visit. There is no height or weight on file to calculate BMI. Constitutional Well developed. Well nourished.  Vascular Dorsalis pedis pulses palpable bilaterally. Posterior tibial pulses palpable bilaterally. Capillary refill normal to all digits.  No cyanosis or clubbing noted. Pedal hair growth normal.  Neurologic Normal speech. Oriented to person, place, and time. Epicritic sensation to light touch grossly present bilaterally.  Dermatologic Nails well groomed and normal in appearance. No open wounds. No skin lesions.  Orthopedic: Normal joint ROM without pain or crepitus bilaterally. No visible deformities. Tender to palpation at the calcaneal tuber bilaterally. No pain with calcaneal squeeze  bilaterally. Ankle ROM diminished range of motion bilaterally. Silfverskiold Test: positive bilaterally.   Radiographs: None  Assessment:   1. Plantar fasciitis, right   2. Plantar fasciitis, left    Plan:  Patient was evaluated and treated and all questions answered.  Plantar Fasciitis, bilaterally - XR reviewed as above.  - Educated on icing and stretching. Instructions given.  - Injection delivered to the plantar fascia as below. - DME: Plantar fascial brace dispensed to support the medial longitudinal arch of the foot and offload pressure from the heel and prevent arch collapse during weightbearing - Pharmacologic management: None  Procedure: Injection Tendon/Ligament Location: Bilateral plantar fascia at the glabrous junction; medial approach. Skin Prep: alcohol Injectate: 0.5 cc 0.5% marcaine plain, 0.5 cc of 1% Lidocaine, 0.5 cc kenalog 10. Disposition: Patient tolerated procedure well. Injection site dressed with a band-aid.  No follow-ups on file.

## 2023-02-02 DIAGNOSIS — H53149 Visual discomfort, unspecified: Secondary | ICD-10-CM | POA: Diagnosis not present

## 2023-02-02 DIAGNOSIS — R1110 Vomiting, unspecified: Secondary | ICD-10-CM | POA: Diagnosis not present

## 2023-02-02 DIAGNOSIS — R519 Headache, unspecified: Secondary | ICD-10-CM | POA: Diagnosis not present

## 2023-02-02 DIAGNOSIS — Z1152 Encounter for screening for COVID-19: Secondary | ICD-10-CM | POA: Diagnosis not present

## 2023-02-03 ENCOUNTER — Ambulatory Visit: Payer: Medicaid Other | Admitting: Podiatry

## 2023-02-22 ENCOUNTER — Ambulatory Visit (INDEPENDENT_AMBULATORY_CARE_PROVIDER_SITE_OTHER): Payer: PRIVATE HEALTH INSURANCE | Admitting: Podiatry

## 2023-02-22 DIAGNOSIS — M62469 Contracture of muscle, unspecified lower leg: Secondary | ICD-10-CM | POA: Diagnosis not present

## 2023-02-22 DIAGNOSIS — M722 Plantar fascial fibromatosis: Secondary | ICD-10-CM

## 2023-02-22 NOTE — Progress Notes (Signed)
  Subjective:  Patient ID: Danielle Pineda, female    DOB: 1981/04/12,  MRN: [id number]  No chief complaint on file.   42 y.o. female presents with the above complaint.  Patient presents for follow-up of bilateral plantar fasciitis.  Patient states that injection helped some but started hurting again.  She wanted get it evaluated denies any other acute complaints.   Review of Systems: Negative except as noted in the HPI. Denies N/V/F/Ch.  Past Medical History:  Diagnosis Date   Sickle cell trait (HCC)     Current Outpatient Medications:    albuterol (PROVENTIL HFA;VENTOLIN HFA) 108 (90 BASE) MCG/ACT inhaler, Inhale 2 puffs into the lungs every 6 (six) hours as needed for wheezing or shortness of breath., Disp: 1 Inhaler, Rfl: 0   ibuprofen (ADVIL) 800 MG tablet, Take by mouth., Disp: , Rfl:    Multiple Vitamin (MULTI-VITAMIN) tablet, Take 1 tablet by mouth daily., Disp: , Rfl:   Social History   Tobacco Use  Smoking Status Never  Smokeless Tobacco Never    No Known Allergies Objective:  There were no vitals filed for this visit. There is no height or weight on file to calculate BMI. Constitutional Well developed. Well nourished.  Vascular Dorsalis pedis pulses palpable bilaterally. Posterior tibial pulses palpable bilaterally. Capillary refill normal to all digits.  No cyanosis or clubbing noted. Pedal hair growth normal.  Neurologic Normal speech. Oriented to person, place, and time. Epicritic sensation to light touch grossly present bilaterally.  Dermatologic Nails well groomed and normal in appearance. No open wounds. No skin lesions.  Orthopedic: Normal joint ROM without pain or crepitus bilaterally. No visible deformities. Tender to palpation at the calcaneal tuber bilaterally. No pain with calcaneal squeeze bilaterally. Ankle ROM diminished range of motion bilaterally. Silfverskiold Test: positive bilaterally.   Radiographs: None  Assessment:   1. Plantar  fasciitis, right   2. Plantar fasciitis, left   3. Gastrocnemius equinus, unspecified laterality     Plan:  Patient was evaluated and treated and all questions answered.  Plantar Fasciitis, bilaterally with underlying gastrocnemius equinus - XR reviewed as above.  - Educated on icing and stretching. Instructions given.  -Second injection delivered to the plantar fascia as below. - DME: Plantar fascial brace dispensed to support the medial longitudinal arch of the foot and offload pressure from the heel and prevent arch collapse during weightbearing - Pharmacologic management: None  Procedure: Injection Tendon/Ligament Location: Bilateral plantar fascia at the glabrous junction; medial approach. Skin Prep: alcohol Injectate: 0.5 cc 0.5% marcaine plain, 0.5 cc of 1% Lidocaine, 0.5 cc kenalog 10. Disposition: Patient tolerated procedure well. Injection site dressed with a band-aid.  No follow-ups on file.

## 2023-03-18 ENCOUNTER — Emergency Department
Admission: EM | Admit: 2023-03-18 | Discharge: 2023-03-18 | Disposition: A | Discharge status: Home / Self Care | Payer: PRIVATE HEALTH INSURANCE | Attending: Emergency Medicine | Admitting: Emergency Medicine

## 2023-03-18 ENCOUNTER — Emergency Department: Payer: PRIVATE HEALTH INSURANCE

## 2023-03-18 ENCOUNTER — Other Ambulatory Visit: Payer: Self-pay

## 2023-03-18 ENCOUNTER — Ambulatory Visit (INDEPENDENT_AMBULATORY_CARE_PROVIDER_SITE_OTHER)
Admission: EM | Admit: 2023-03-18 | Discharge: 2023-03-18 | Disposition: A | Discharge status: Home / Self Care | Payer: PRIVATE HEALTH INSURANCE | Source: Home / Self Care

## 2023-03-18 DIAGNOSIS — R8290 Unspecified abnormal findings in urine: Secondary | ICD-10-CM

## 2023-03-18 DIAGNOSIS — K760 Fatty (change of) liver, not elsewhere classified: Secondary | ICD-10-CM

## 2023-03-18 DIAGNOSIS — R8271 Bacteriuria: Secondary | ICD-10-CM | POA: Diagnosis not present

## 2023-03-18 DIAGNOSIS — R1084 Generalized abdominal pain: Secondary | ICD-10-CM | POA: Insufficient documentation

## 2023-03-18 LAB — COMPREHENSIVE METABOLIC PANEL
ALT: 18 U/L (ref 0–44)
ALT: 18 U/L (ref 0–44)
AST: 18 U/L (ref 15–41)
AST: 17 U/L (ref 15–41)
Albumin: 3.8 g/dL (ref 3.5–5.0)
Albumin: 3.8 g/dL (ref 3.5–5.0)
Alkaline Phosphatase: 41 U/L (ref 38–126)
Alkaline Phosphatase: 42 U/L (ref 38–126)
Anion gap: 7 (ref 5–15)
Anion gap: 6 (ref 5–15)
BUN: 11 mg/dL (ref 6–20)
BUN: 11 mg/dL (ref 6–20)
CO2: 28 mmol/L (ref 22–32)
CO2: 26 mmol/L (ref 22–32)
Calcium: 9.1 mg/dL (ref 8.9–10.3)
Calcium: 9 mg/dL (ref 8.9–10.3)
Chloride: 102 mmol/L (ref 98–111)
Chloride: 105 mmol/L (ref 98–111)
Creatinine, Ser: 0.84 mg/dL (ref 0.44–1.00)
Creatinine, Ser: 0.83 mg/dL (ref 0.44–1.00)
GFR, Estimated: >60 mL/min (ref >60)
GFR, Estimated: >60 mL/min (ref >60)
Glucose, Bld: 105 mg/dL — ABNORMAL HIGH (ref 70–99)
Glucose, Bld: 104 mg/dL — ABNORMAL HIGH (ref 70–99)
Potassium: 4.2 mmol/L (ref 3.5–5.1)
Potassium: 4.1 mmol/L (ref 3.5–5.1)
Sodium: 137 mmol/L (ref 135–145)
Sodium: 137 mmol/L (ref 135–145)
Total Bilirubin: 0.9 mg/dL (ref <1.2)
Total Bilirubin: 1.1 mg/dL (ref <1.2)
Total Protein: 7.3 g/dL (ref 6.5–8.1)
Total Protein: 7.3 g/dL (ref 6.5–8.1)

## 2023-03-18 LAB — URINALYSIS, W/ REFLEX TO CULTURE (INFECTION SUSPECTED)
Bilirubin Urine: NEGATIVE
Glucose, UA: NEGATIVE mg/dL
Hgb urine dipstick: NEGATIVE
Ketones, ur: NEGATIVE mg/dL
Nitrite: NEGATIVE
Protein, ur: NEGATIVE mg/dL
Specific Gravity, Urine: 1.015 (ref 1.005–1.030)
pH: 7.5 (ref 5.0–8.0)

## 2023-03-18 LAB — CBC WITH DIFFERENTIAL/PLATELET
Abs Immature Granulocytes: 0.01 10*3/uL (ref 0.00–0.07)
Basophils Absolute: 0 10*3/uL (ref 0.0–0.1)
Basophils Relative: 0 %
Eosinophils Absolute: 0 10*3/uL (ref 0.0–0.5)
Eosinophils Relative: 1 %
HCT: 35.4 % — ABNORMAL LOW (ref 36.0–46.0)
Hemoglobin: 12.6 g/dL (ref 12.0–15.0)
Immature Granulocytes: 0 %
Lymphocytes Relative: 31 %
Lymphs Abs: 2.2 10*3/uL (ref 0.7–4.0)
MCH: 26.5 pg (ref 26.0–34.0)
MCHC: 35.6 g/dL (ref 30.0–36.0)
MCV: 74.5 fL — ABNORMAL LOW (ref 80.0–100.0)
Monocytes Absolute: 0.5 10*3/uL (ref 0.1–1.0)
Monocytes Relative: 8 %
Neutro Abs: 4.2 10*3/uL (ref 1.7–7.7)
Neutrophils Relative %: 60 %
Platelets: 155 10*3/uL (ref 150–400)
RBC: 4.75 MIL/uL (ref 3.87–5.11)
RDW: 15.5 % (ref 11.5–15.5)
WBC: 7 10*3/uL (ref 4.0–10.5)
nRBC: 0 % (ref 0.0–0.2)

## 2023-03-18 LAB — URINALYSIS, ROUTINE W REFLEX MICROSCOPIC
Bilirubin Urine: NEGATIVE
Glucose, UA: NEGATIVE mg/dL
Hgb urine dipstick: NEGATIVE
Ketones, ur: NEGATIVE mg/dL
Nitrite: NEGATIVE
Protein, ur: NEGATIVE mg/dL
Specific Gravity, Urine: 1.005 (ref 1.005–1.030)
pH: 7 (ref 5.0–8.0)

## 2023-03-18 LAB — CBC
HCT: 36.3 % (ref 36.0–46.0)
Hemoglobin: 12.6 g/dL (ref 12.0–15.0)
MCH: 26.4 pg (ref 26.0–34.0)
MCHC: 34.7 g/dL (ref 30.0–36.0)
MCV: 76.1 fL — ABNORMAL LOW (ref 80.0–100.0)
Platelets: 162 10*3/uL (ref 150–400)
RBC: 4.77 MIL/uL (ref 3.87–5.11)
RDW: 15.2 % (ref 11.5–15.5)
WBC: 7.1 10*3/uL (ref 4.0–10.5)
nRBC: 0 % (ref 0.0–0.2)

## 2023-03-18 LAB — WET PREP, GENITAL
Clue Cells Wet Prep HPF POC: NONE SEEN
Sperm: NONE SEEN
Trich, Wet Prep: NONE SEEN
WBC, Wet Prep HPF POC: <10 (ref <10)
Yeast Wet Prep HPF POC: NONE SEEN

## 2023-03-18 LAB — LIPASE, BLOOD: Lipase: 21 U/L (ref 11–51)

## 2023-03-18 LAB — POC URINE PREG, ED: Preg Test, Ur: NEGATIVE

## 2023-03-18 LAB — CHLAMYDIA/NGC RT PCR (ARMC ONLY)
Chlamydia Tr: NOT DETECTED
N gonorrhoeae: NOT DETECTED

## 2023-03-18 LAB — PREGNANCY, URINE: Preg Test, Ur: NEGATIVE

## 2023-03-18 MED ORDER — DICYCLOMINE HCL 20 MG PO TABS: 20.0000 mg | ORAL_TABLET | Freq: Three times a day (TID) | ORAL | 0 refills | Status: DC

## 2023-03-18 MED ORDER — CEPHALEXIN 500 MG PO CAPS
500.0000 mg | ORAL_CAPSULE | Freq: Once | ORAL | Status: AC
  Administered 2023-03-18: 500 mg via ORAL
  Filled 2023-03-18: qty 1

## 2023-03-18 MED ORDER — IOHEXOL 300 MG/ML  SOLN
100.0000 mL | Freq: Once | INTRAMUSCULAR | Status: AC | PRN
  Administered 2023-03-18: 100 mL via INTRAVENOUS

## 2023-03-18 MED ORDER — CEPHALEXIN 500 MG PO CAPS: 500.0000 mg | ORAL_CAPSULE | Freq: Four times a day (QID) | ORAL | 0 refills | Status: AC

## 2023-03-18 NOTE — ED Triage Notes (Signed)
Abdominal pain x 2 weeks. Patient states that the pain moves around in her stomach. Patient thought it was gas. Regular normal BM. Hasn't taken anything for it/

## 2023-03-18 NOTE — Discharge Instructions (Addendum)
There was some bacteria in your urine for which I started you on antibiotics to treat suspected urinary infection.  The rest of your testing in the emergency department did not show any emergency conditions that would account for your abdominal pain.  Check MyChart for the results of your STI testing and call your doctor or return to the emergency department for treatment if positive.  Thank you for choosing [place] for your health care today!  Please see your primary doctor this week for a follow up appointment.   If you have any new, worsening, or unexpected symptoms call your doctor right away or come back to the emergency department for reevaluation.  It was my pleasure to care for you today.   Daneil Dan Modesto Charon, MD

## 2023-03-18 NOTE — ED Provider Notes (Signed)
[facility] Provider Note    Event Date/Time   First MD Initiated Contact with Patient 03/18/23 1202     (approximate)   History   No chief complaint on file.   HPI  Danielle Pineda is a 42 y.o. female   Past medical history of no significant past medical history presents to Emergency Department with abdominal discomfort constant over the last several weeks.  Was seen at urgent care, had testing which was unremarkable and was ultimately discharged with close monitoring of symptoms.  However given no improvement in symptoms, came to the emergency department for further evaluation today.  Constant diffuse abdominal pain no nausea vomiting or diarrhea.  No urinary symptoms like dysuria or frequency.  No back pain.  No fevers or chills.  No history of abdominal surgeries.  No trauma.  No vaginal discharge.  Sexually active with 1 partner.  Amenable for STI testing.   External Medical Documents Reviewed: Abdominal ultrasound performed in October 2023 showed no gallstones, urgent care note from earlier this week for abdominal pain, no imaging performed at that time      Physical Exam   Triage Vital Signs: ED Triage Vitals  Encounter Vitals Group     BP 03/18/23 1145 135/82     Systolic BP Percentile --      Diastolic BP Percentile --      Pulse Rate 03/18/23 1145 86     Resp 03/18/23 1145 18     Temp 03/18/23 1145 98.2 F (36.8 C)     Temp Source 03/18/23 1145 Oral     SpO2 03/18/23 1145 100 %     Weight 03/18/23 1146 274 lb 4.8 oz (124.4 kg)     Height 03/18/23 1146 5\' 4"  (1.626 m)     Head Circumference --      Peak Flow --      Pain Score 03/18/23 1145 8     Pain Loc --      Pain Education --      Exclude from Growth Chart --     Most recent vital signs: Vitals:   03/18/23 1145 03/18/23 1233  BP: 135/82   Pulse: 86   Resp: 18   Temp: 98.2 F (36.8 C)   SpO2: 100% 100%    General: Awake, no distress.  CV:  Good peripheral  perfusion.  Resp:  Normal effort.  Abd:  No distention.  Other:  Diffuse mild tenderness to palpation without rigidity or guarding.  No CVA tenderness.  Deferring pelvic exam opting for self swab for STIs.   ED Results / Procedures / Treatments   Labs (all labs ordered are listed, but only abnormal results are displayed) Labs Reviewed  COMPREHENSIVE METABOLIC PANEL - Abnormal; Notable for the following components:      Result Value   Glucose, Bld 104 (*)    All other components within normal limits  CBC - Abnormal; Notable for the following components:   MCV 76.1 (*)    All other components within normal limits  URINALYSIS, ROUTINE W REFLEX MICROSCOPIC - Abnormal; Notable for the following components:   Color, Urine YELLOW (*)    APPearance HAZY (*)    Leukocytes,Ua LARGE (*)    Bacteria, UA RARE (*)    All other components within normal limits  WET PREP, GENITAL  CHLAMYDIA/NGC RT PCR ([facility] ONLY)            LIPASE, BLOOD  POC URINE PREG, ED  I ordered and reviewed the above labs they are notable for cell counts electrolytes largely unremarkable, rare bacteria, leukocytes.    RADIOLOGY I independently reviewed and interpreted the abdomen pelvis and see no obvious gallstones or obstructive pathologies I also reviewed radiologist's formal read.   PROCEDURES:  Critical Care performed: No  Procedures   MEDICATIONS ORDERED IN ED: Medications  iohexol (OMNIPAQUE) 300 MG/ML solution 100 mL (100 mLs Intravenous Contrast Given 03/18/23 1304)     IMPRESSION / MDM / ASSESSMENT AND PLAN / ED COURSE  I reviewed the triage vital signs and the nursing notes.                                Patient's presentation is most consistent with acute presentation with potential threat to life or bodily function.  Differential diagnosis includes, but is not limited to, appendicitis, cholecystitis, biliary colic, choledocholithiasis, pancreatitis, obstruction, gas pains,  constipation, pelvic inflammatory disease, urinary tract infection, pyelonephritis, renal colic   The patient is on the cardiac monitor to evaluate for evidence of arrhythmia and/or significant heart rate changes.  MDM:    Differential diagnosis in this patient with constant unchanged diffuse abdominal pain with tenderness to palpation diffusely.  Given subacute nature and normal vital signs I doubt life-threatening emergency at this time but I think would be prudent to check with CT abdomen pelvis for above differential diagnosis.    Urinalysis with rare bacteria and some inflammatory changes though no dysuria or frequency.  Ultimately if remainder of testing is unremarkable can start on a short course of antibiotics to treat potential lower urinary tract infection.  Checking STI per patient wishes, deferring pelvic exam, no empiric treatment given low suspicion low risk factors.  If remainder of testing completed prior to this resulting she will check MyChart for results and treatment as needed.  -- If no emergent pathologies noted on CT scan anticipate discharge with short course of antibiotics for suspected urinary tract infection follow-up with PMD.       FINAL CLINICAL IMPRESSION(S) / ED DIAGNOSES   Final diagnoses:  Generalized abdominal pain  Bacteriuria     Rx / DC Orders   ED Discharge Orders     None        Note:  This document was prepared using Dragon voice recognition software and may include unintentional dictation errors.    Pilar Jarvis, MD 03/18/23 1328

## 2023-03-18 NOTE — ED Provider Notes (Signed)
[facility]    CSN: [id number] Arrival date & time: 03/18/23  0859      History   Chief Complaint Chief Complaint  Patient presents with   Abdominal Pain    HPI Danielle Pineda is a 42 y.o. female.   Patient reports intermittent "pounding and throbbing, cramping" generalized abdominal pain x 2 weeks. Denies fever, nausea/vomiting, diarrhea, constipation, painful urination, frequency, urgency, vaginal discharge/odor. No flank pain. Normal appetite and no unintentional weight loss. No diet changes. Current pain is 8/10 around the umbilicus. Pain is worse with laying down and getting up and walking around. Sometimes breathing makes symptoms worse as well.   Not taking anything OTC. No routine meds taken.   No history of GI issues.   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 No pertinent surgical history.  OB History   No obstetric history on file.      Home Medications    Prior to Admission medications   Medication Sig Start Date End Date Taking? Authorizing Provider  dicyclomine (BENTYL) 20 MG tablet Take 1 tablet (20 mg total) by mouth 4 (four) times daily -  before meals and at bedtime. 03/18/23  Yes Eusebio Friendly B, PA-C  albuterol (PROVENTIL HFA;VENTOLIN HFA) 108 (90 BASE) MCG/ACT inhaler Inhale 2 puffs into the lungs every 6 (six) hours as needed for wheezing or shortness of breath. 10/02/14   Rebecka Apley, MD  ibuprofen (ADVIL) 800 MG tablet Take by mouth.    [provider]  Multiple Vitamin (MULTI-VITAMIN) tablet Take 1 tablet by mouth daily.     [provider]    Family History History reviewed. No pertinent family history.  Social History Social History   Tobacco Use   Smoking status: Never   Smokeless tobacco: Never  Substance Use Topics   Alcohol use: Yes    Comment: occasionally    Drug use: No     Allergies   Patient has no known allergies.   Review of Systems Review of Systems  Constitutional:  Negative for appetite change, fatigue, fever and unexpected weight change.  HENT:  Negative for congestion.   Respiratory:  Negative for cough and shortness of breath.   Cardiovascular:  Negative for chest pain.  Gastrointestinal:  Positive for abdominal pain. Negative for abdominal distention, anal bleeding, blood in stool, constipation, diarrhea, nausea, rectal pain and vomiting.  Genitourinary:  Negative for difficulty urinating, dysuria, flank pain, frequency, hematuria, menstrual problem, pelvic pain and vaginal discharge.  Musculoskeletal:  Negative for back pain.  Neurological:  Negative for dizziness, weakness and headaches.     Physical Exam Triage Vital Signs ED Triage Vitals  Encounter Vitals Group     BP 03/18/23 0914 134/88     Systolic BP Percentile --      Diastolic BP Percentile --      Pulse Rate 03/18/23 0914 75     Resp 03/18/23 0914 18     Temp 03/18/23 0914 98.4 F (36.9 C)     Temp Source 03/18/23 0914 Oral  SpO2 03/18/23 0914 98 %     Weight --      Height --      Head Circumference --      Peak Flow --      Pain Score 03/18/23 0913 8     Pain Loc --      Pain Education --      Exclude from Growth Chart --    No data found.  Updated Vital Signs BP 134/88 (BP Location: Left Arm)   Pulse 75   Temp 98.4 F (36.9 C) (Oral)   Resp 18   LMP 02/28/2023 (Approximate)   SpO2 98%     Physical Exam Vitals and nursing note reviewed.  Constitutional:      General: She is not in acute distress.    Appearance: Normal appearance. She is obese. She is not ill-appearing  or toxic-appearing.  HENT:     Head: Normocephalic and atraumatic.     Nose: Nose normal.     Mouth/Throat:     Mouth: Mucous membranes are moist.     Pharynx: Oropharynx is clear.  Eyes:     General: No scleral icterus.       Right eye: No discharge.        Left eye: No discharge.     Conjunctiva/sclera: Conjunctivae normal.  Cardiovascular:     Rate and Rhythm: Normal rate and regular rhythm.     Heart sounds: Normal heart sounds.  Pulmonary:     Effort: Pulmonary effort is normal. No respiratory distress.     Breath sounds: Normal breath sounds.  Abdominal:     Palpations: Abdomen is soft.     Tenderness: There is generalized abdominal tenderness. There is guarding (RUQ, suprapubic). There is no right CVA tenderness, left CVA tenderness or rebound.  Musculoskeletal:     Cervical back: Neck supple.  Skin:    General: Skin is dry.  Neurological:     General: No focal deficit present.     Mental Status: She is alert. Mental status is at baseline.     Motor: No weakness.     Gait: Gait normal.  Psychiatric:        Mood and Affect: Mood normal.        Behavior: Behavior normal.        Thought Content: Thought content normal.      UC Treatments / Results  Labs (all labs ordered are listed, but only abnormal results are displayed) Labs Reviewed  COMPREHENSIVE METABOLIC PANEL - Abnormal; Notable for the following components:      Result Value   Glucose, Bld 105 (*)    All other components within normal limits  CBC WITH DIFFERENTIAL/PLATELET - Abnormal; Notable for the following components:   HCT 35.4 (*)    MCV 74.5 (*)    All other components within normal limits  URINALYSIS, W/ REFLEX TO CULTURE (INFECTION SUSPECTED) - Abnormal; Notable for the following components:   Leukocytes,Ua MODERATE (*)    Bacteria, UA FEW (*)    All other components within normal limits  URINE CULTURE  PREGNANCY, URINE    EKG   Radiology No results found.  Procedures Procedures  (including critical care time)  Medications Ordered in UC Medications - No data to display  Initial Impression / Assessment and Plan / UC Course  I have reviewed the triage vital signs and the nursing notes.  Pertinent labs & imaging results that were available during my care of the patient  were reviewed by me and considered in my medical decision making (see chart for details).   42 year old female presents for generalized abdominal pain x 2 weeks.  She reports that she started to have right upper quadrant pain last night.  She says most of her pain is around her umbilicus and she describes it as throbbing, cramping.  No fever, fatigue, nausea/vomiting/diarrhea, constipation, dysuria, frequency, urgency, flank pain, vaginal discharge.  No expected weight changes.  No history of GI problems or GI surgery.  Vitals are normal and stable and she is overall well-appearing.  On exam she has soft abdomen with generalized tenderness to palpation but most tenderness of the right upper quadrant and suprapubic region.  No rebound.  No CVA tenderness.  Chest clear.  Ordered CBC, CMP, UA and urine pregnancy test.  CBC with normal WBC count.  Patient is not anemic.  CMP with normal electrolytes, normal transaminases and kidney function.  Urinalysis shows moderate leukocytes so we will send for culture but she is not having any urinary symptoms.  Will treat for UTI if culture is positive and symptoms are persistent.  Urine pregnancy negative.  Reviewed all these results with patient.  Offered her ketorolac injection but she declines.  Explained to patient that I am unsure as to the cause of her abdominal pain but does not appear to be emergent.  She says that she is having severe pain and thinks she might go to the emergency department.  She is first going to talk to her primary care provider.  She says she is going to go by there at this time.  I offered to send dicyclomine to help with the cramping and she  agrees.  Encouraged increasing rest and fluids.  Advised patient if her symptoms are that severe then she should consider going to the ED for imaging.  She says that she feels like she needs an ultrasound or CT.  Unable to perform these modalities at this urgent care and it is not really indicated based on her lab findings.   Final Clinical Impressions(s) / UC Diagnoses   Final diagnoses:  Generalized abdominal pain  Abnormal urinalysis     Discharge Instructions      -Your labs were all reassuring. - There are white blood cells the urine so we can a culture and we will call you if you need antibiotics in the next couple days. - If you are worried about your symptoms being severe you not receiving any relief from the medication I sent to the pharmacy consider going to the emergency department for potential imaging.  ABDOMINAL PAIN: You may take Tylenol for pain relief. Use medications as directed including antiemetics and antidiarrheal medications if suggested or prescribed. You should increase fluids and electrolytes as well as rest over these next several days. If you have any questions or concerns, or if your symptoms are not improving or if especially if they acutely worsen, please call or stop back to the clinic immediately and we will be happy to help you or go to the ER   ABDOMINAL PAIN RED FLAGS: Seek immediate further care if: symptoms remain the same or worsen over the next 3-7 days, you are unable to keep fluids down, you see blood or mucus in your stool, you vomit black or dark red material, you have a fever of 101.F or higher, you have localized and/or persistent abdominal pain    Please follow up with PCP.     ED Prescriptions  Medication Sig Dispense Auth. Provider   dicyclomine (BENTYL) 20 MG tablet Take 1 tablet (20 mg total) by mouth 4 (four) times daily -  before meals and at bedtime. 20 tablet Gareth Morgan      PDMP not reviewed this encounter.    Shirlee Latch, PA-C 03/18/23 1115

## 2023-03-18 NOTE — ED Triage Notes (Signed)
Pt sts that she has been having abd pain for the last two weeks. Pt sts that she is unable to sleep at night due to the pain. Pt denies any N/V/D.

## 2023-03-18 NOTE — Discharge Instructions (Addendum)
-  Your labs were all reassuring. - There are white blood cells the urine so we can a culture and we will call you if you need antibiotics in the next couple days. - If you are worried about your symptoms being severe you not receiving any relief from the medication I sent to the pharmacy consider going to the emergency department for potential imaging.  ABDOMINAL PAIN: You may take Tylenol for pain relief. Use medications as directed including antiemetics and antidiarrheal medications if suggested or prescribed. You should increase fluids and electrolytes as well as rest over these next several days. If you have any questions or concerns, or if your symptoms are not improving or if especially if they acutely worsen, please call or stop back to the clinic immediately and we will be happy to help you or go to the ER   ABDOMINAL PAIN RED FLAGS: Seek immediate further care if: symptoms remain the same or worsen over the next 3-7 days, you are unable to keep fluids down, you see blood or mucus in your stool, you vomit black or dark red material, you have a fever of 101.F or higher, you have localized and/or persistent abdominal pain    Please follow up with PCP.

## 2023-03-19 LAB — URINE CULTURE

## 2023-03-22 ENCOUNTER — Ambulatory Visit (INDEPENDENT_AMBULATORY_CARE_PROVIDER_SITE_OTHER): Payer: PRIVATE HEALTH INSURANCE | Admitting: Podiatry

## 2023-03-22 ENCOUNTER — Encounter: Payer: Self-pay | Admitting: Podiatry

## 2023-03-22 DIAGNOSIS — M722 Plantar fascial fibromatosis: Secondary | ICD-10-CM | POA: Diagnosis not present

## 2023-03-22 NOTE — Progress Notes (Signed)
  Subjective:  Patient ID: Danielle Pineda, female    DOB: 1981/03/26,  MRN: [id number]  Chief Complaint  Patient presents with   Plantar Fasciitis    "The left one is okay but the other is not, it still hurts in the arch and the side of the heel."    42 y.o. female presents with the above complaint.  Patient presents for follow-up of bilateral plantar fasciitis.  She states that the left side is much more manageable the right side still hurting has not gotten any better   Review of Systems: Negative except as noted in the HPI. Denies N/V/F/Ch.  Past Medical History:  Diagnosis Date   Sickle cell trait (HCC)     Current Outpatient Medications:    albuterol (PROVENTIL HFA;VENTOLIN HFA) 108 (90 BASE) MCG/ACT inhaler, Inhale 2 puffs into the lungs every 6 (six) hours as needed for wheezing or shortness of breath., Disp: 1 Inhaler, Rfl: 0   cephALEXin (KEFLEX) 500 MG capsule, Take 1 capsule (500 mg total) by mouth 4 (four) times daily for 5 days., Disp: 20 capsule, Rfl: 0   dicyclomine (BENTYL) 20 MG tablet, Take 1 tablet (20 mg total) by mouth 4 (four) times daily -  before meals and at bedtime., Disp: 20 tablet, Rfl: 0   ibuprofen (ADVIL) 800 MG tablet, Take by mouth., Disp: , Rfl:    Multiple Vitamin (MULTI-VITAMIN) tablet, Take 1 tablet by mouth daily., Disp: , Rfl:   Social History   Tobacco Use  Smoking Status Never  Smokeless Tobacco Never    No Known Allergies Objective:  There were no vitals filed for this visit. There is no height or weight on file to calculate BMI. Constitutional Well developed. Well nourished.  Vascular Dorsalis pedis pulses palpable bilaterally. Posterior tibial pulses palpable bilaterally. Capillary refill normal to all digits.  No cyanosis or clubbing noted. Pedal hair growth normal.  Neurologic Normal speech. Oriented to person, place, and time. Epicritic sensation to light touch grossly present bilaterally.  Dermatologic Nails well groomed  and normal in appearance. No open wounds. No skin lesions.  Orthopedic: Normal joint ROM without pain or crepitus bilaterally. No visible deformities. Tender to palpation at the calcaneal tuber right No pain with calcaneal squeeze bilaterally. Ankle ROM diminished range of motion bilaterally. Silfverskiold Test: positive bilaterally.   Radiographs: None  Assessment:   1. Plantar fasciitis, right      Plan:  Patient was evaluated and treated and all questions answered.  Left plantar fasciitis -Clinically healed and manageable  Plantar Fasciitis, bilaterally with underlying gastrocnemius equinus right greater than left - XR reviewed as above.  - Educated on icing and stretching. Instructions given.  -No further injections that have not helped - DME: Cam boot to the right side - Pharmacologic management: None   No follow-ups on file.

## 2023-04-05 ENCOUNTER — Encounter: Payer: Self-pay | Admitting: Podiatry

## 2023-04-05 ENCOUNTER — Ambulatory Visit (INDEPENDENT_AMBULATORY_CARE_PROVIDER_SITE_OTHER): Payer: PRIVATE HEALTH INSURANCE | Admitting: Podiatry

## 2023-04-05 VITALS — Ht 64.0 in | Wt 274.3 lb

## 2023-04-05 DIAGNOSIS — M722 Plantar fascial fibromatosis: Secondary | ICD-10-CM | POA: Diagnosis not present

## 2023-04-05 NOTE — Progress Notes (Signed)
  Subjective:  Patient ID: Danielle Pineda, female    DOB: 1980-04-30,  MRN: [id number]  Chief Complaint  Patient presents with   Foot Pain    Pt is here to f/u on right foot pain. Pt states the pain has not went away and that the pain has came back to her left foot.    42 y.o. female presents with the above complaint.  Patient presents for follow-up of bilateral plantar fasciitis.  She states that the left side is much more manageable the right side still hurting has not gotten any better   Review of Systems: Negative except as noted in the HPI. Denies N/V/F/Ch.  Past Medical History:  Diagnosis Date   Sickle cell trait (HCC)     Current Outpatient Medications:    albuterol (PROVENTIL HFA;VENTOLIN HFA) 108 (90 BASE) MCG/ACT inhaler, Inhale 2 puffs into the lungs every 6 (six) hours as needed for wheezing or shortness of breath., Disp: 1 Inhaler, Rfl: 0   dicyclomine (BENTYL) 20 MG tablet, Take 1 tablet (20 mg total) by mouth 4 (four) times daily -  before meals and at bedtime., Disp: 20 tablet, Rfl: 0   ibuprofen (ADVIL) 800 MG tablet, Take by mouth., Disp: , Rfl:    Multiple Vitamin (MULTI-VITAMIN) tablet, Take 1 tablet by mouth daily., Disp: , Rfl:   Social History   Tobacco Use  Smoking Status Never  Smokeless Tobacco Never    No Known Allergies Objective:  There were no vitals filed for this visit. Body mass index is 47.08 kg/m. Constitutional Well developed. Well nourished.  Vascular Dorsalis pedis pulses palpable bilaterally. Posterior tibial pulses palpable bilaterally. Capillary refill normal to all digits.  No cyanosis or clubbing noted. Pedal hair growth normal.  Neurologic Normal speech. Oriented to person, place, and time. Epicritic sensation to light touch grossly present bilaterally.  Dermatologic Nails well groomed and normal in appearance. No open wounds. No skin lesions.  Orthopedic: Normal joint ROM without pain or crepitus bilaterally. No visible  deformities. Tender to palpation at the calcaneal tuber right No pain with calcaneal squeeze bilaterally. Ankle ROM diminished range of motion bilaterally. Silfverskiold Test: positive bilaterally.   Radiographs: None  Assessment:   No diagnosis found.    Plan:  Patient was evaluated and treated and all questions answered.  Left plantar fasciitis -Clinically healed and manageable  Plantar Fasciitis, bilaterally with underlying gastrocnemius equinus right greater than left - XR reviewed as above.  - Educated on icing and stretching. Instructions given.  -No further injections that have not helped - DME: Continues in cam boot to the right side - Pharmacologic management: None -Physical therapy prescription was given as patient would like to try that before surgery   No follow-ups on file.

## 2023-05-17 ENCOUNTER — Encounter: Payer: Self-pay | Admitting: Podiatry

## 2023-05-17 ENCOUNTER — Ambulatory Visit (INDEPENDENT_AMBULATORY_CARE_PROVIDER_SITE_OTHER): Payer: PRIVATE HEALTH INSURANCE | Admitting: Podiatry

## 2023-05-17 VITALS — Ht 64.0 in | Wt 274.3 lb

## 2023-05-17 DIAGNOSIS — M722 Plantar fascial fibromatosis: Secondary | ICD-10-CM | POA: Diagnosis not present

## 2023-05-17 NOTE — Progress Notes (Signed)
  Subjective:  Patient ID: Danielle Pineda, female    DOB: 1981-03-20,  MRN: [id number]  Chief Complaint  Patient presents with   Foot Pain    Pt is here to f/u with bilateral foot pain, pt states her right foot is giving her the most trouble, and that it hurts to walk on that foot, she states her left foot is ok and not as bad as her right foot.    43 y.o. female presents with the above complaint.  Patient presents for follow-up of bilateral plantar fasciitis.  She states that the left side is much more manageable the right side still hurting has not gotten any better   Review of Systems: Negative except as noted in the HPI. Denies N/V/F/Ch.  Past Medical History:  Diagnosis Date   Sickle cell trait (HCC)     Current Outpatient Medications:    albuterol (PROVENTIL HFA;VENTOLIN HFA) 108 (90 BASE) MCG/ACT inhaler, Inhale 2 puffs into the lungs every 6 (six) hours as needed for wheezing or shortness of breath., Disp: 1 Inhaler, Rfl: 0   dicyclomine (BENTYL) 20 MG tablet, Take 1 tablet (20 mg total) by mouth 4 (four) times daily -  before meals and at bedtime., Disp: 20 tablet, Rfl: 0   ibuprofen (ADVIL) 800 MG tablet, Take by mouth., Disp: , Rfl:    Multiple Vitamin (MULTI-VITAMIN) tablet, Take 1 tablet by mouth daily., Disp: , Rfl:   Social History   Tobacco Use  Smoking Status Never  Smokeless Tobacco Never    No Known Allergies Objective:  There were no vitals filed for this visit. Body mass index is 47.08 kg/m. Constitutional Well developed. Well nourished.  Vascular Dorsalis pedis pulses palpable bilaterally. Posterior tibial pulses palpable bilaterally. Capillary refill normal to all digits.  No cyanosis or clubbing noted. Pedal hair growth normal.  Neurologic Normal speech. Oriented to person, place, and time. Epicritic sensation to light touch grossly present bilaterally.  Dermatologic Nails well groomed and normal in appearance. No open wounds. No skin lesions.   Orthopedic: Normal joint ROM without pain or crepitus bilaterally. No visible deformities. Tender to palpation at the calcaneal tuber right No pain with calcaneal squeeze bilaterally. Ankle ROM diminished range of motion bilaterally. Silfverskiold Test: positive bilaterally.   Radiographs: None  Assessment:   1. Plantar fasciitis, right       Plan:  Patient was evaluated and treated and all questions answered.  Left plantar fasciitis -Clinically healed and manageable  Plantar Fasciitis, bilaterally with underlying gastrocnemius equinus right greater than left - XR reviewed as above.  - Educated on icing and stretching. Instructions given.  -No further injections that have not helped - DME: Continues in cam boot to the right side - Pharmacologic management: None -Physical therapy prescription was given as patient would like to try that before surgery   No follow-ups on file.

## 2023-06-28 ENCOUNTER — Ambulatory Visit: Payer: PRIVATE HEALTH INSURANCE | Admitting: Podiatry

## 2023-08-08 ENCOUNTER — Ambulatory Visit: Payer: Self-pay | Admitting: *Deleted

## 2023-08-08 NOTE — Telephone Encounter (Signed)
  Chief Complaint: low back pain  Symptoms: low back pain after getting out of chair Saturday.  Frequency: constant back pain 7-8/10 pain level. Has tried heat pad  and continues to have pain Pertinent Negatives: Patient denies radiating pain. No issues with B/B.  Disposition: [] ED /[x] Urgent Care (no appt availability in office) / [] Appointment(In office/virtual)/ []  [facility]/ [] Home Care/ [] Refused Recommended Disposition /[] [facility]/ []  Follow-up with PCP Additional Notes:   No PCP. Recommended UC or ED. Offered location to mobile bus for tomorrow, unsure patient will go due to location.      Copied from CRM [id number]. Topic: Clinical - Red Word Triage >> Aug 08, 2023  3:58 PM Oddis Bench wrote: Red Word that prompted transfer to Nurse Triage: Patient is complaining of lower back pain that is constant. Reason for Disposition  [1] MODERATE back pain (e.g., interferes with normal activities) AND [2] present > 3 days  Answer Assessment - Initial Assessment Questions 1. ONSET: "When did the pain begin?"      Saturday  2. LOCATION: "Where does it hurt?" (upper, mid or lower back)     Low back  3. SEVERITY: "How bad is the pain?"  (e.g., Scale 1-10; mild, moderate, or severe)   - MILD (1-3): Doesn't interfere with normal activities.    - MODERATE (4-7): Interferes with normal activities or awakens from sleep.    - SEVERE (8-10): Excruciating pain, unable to do any normal activities.      7-8/10 trouble sleeping  4. PATTERN: "Is the pain constant?" (e.g., yes, no; constant, intermittent)      Constant  5. RADIATION: "Does the pain shoot into your legs or somewhere else?"     no 6. CAUSE:  "What do you think is causing the back pain?"      Not sure  7. BACK OVERUSE:  "Any recent lifting of heavy objects, strenuous work or exercise?"     na 8. MEDICINES: "What have you taken so far for the pain?" (e.g., nothing, acetaminophen, NSAIDS)     Na  9. NEUROLOGIC  SYMPTOMS: "Do you have any weakness, numbness, or problems with bowel/bladder control?"     No  10. OTHER SYMPTOMS: "Do you have any other symptoms?" (e.g., fever, abdomen pain, burning with urination, blood in urine)       Low back pain  11. PREGNANCY: "Is there any chance you are pregnant?" "When was your last menstrual period?"       na  Protocols used: Back Pain-A-AH

## 2023-08-30 ENCOUNTER — Ambulatory Visit (INDEPENDENT_AMBULATORY_CARE_PROVIDER_SITE_OTHER): Payer: PRIVATE HEALTH INSURANCE | Admitting: Podiatry

## 2023-08-30 DIAGNOSIS — M722 Plantar fascial fibromatosis: Secondary | ICD-10-CM | POA: Diagnosis not present

## 2023-08-30 NOTE — Progress Notes (Signed)
  Subjective:  Patient ID: Danielle Pineda, female    DOB: 1981-02-06,  MRN: [id number]  Chief Complaint  Patient presents with   Plantar Fasciitis    43 y.o. female presents with the above complaint.  Patient presents for follow-up of bilateral plantar fasciitis.  Patient states that injection helped some but started hurting again.  She wanted get it evaluated denies any other acute complaints.   Review of Systems: Negative except as noted in the HPI. Denies N/V/F/Ch.  Past Medical History:  Diagnosis Date   Sickle cell trait (HCC)     Current Outpatient Medications:    albuterol  (PROVENTIL  HFA;VENTOLIN  HFA) 108 (90 BASE) MCG/ACT inhaler, Inhale 2 puffs into the lungs every 6 (six) hours as needed for wheezing or shortness of breath., Disp: 1 Inhaler, Rfl: 0   dicyclomine  (BENTYL ) 20 MG tablet, Take 1 tablet (20 mg total) by mouth 4 (four) times daily -  before meals and at bedtime., Disp: 20 tablet, Rfl: 0   ibuprofen  (ADVIL ) 800 MG tablet, Take by mouth., Disp: , Rfl:    Multiple Vitamin (MULTI-VITAMIN) tablet, Take 1 tablet by mouth daily., Disp: , Rfl:   Social History   Tobacco Use  Smoking Status Never  Smokeless Tobacco Never    No Known Allergies Objective:  There were no vitals filed for this visit. There is no height or weight on file to calculate BMI. Constitutional Well developed. Well nourished.  Vascular Dorsalis pedis pulses palpable bilaterally. Posterior tibial pulses palpable bilaterally. Capillary refill normal to all digits.  No cyanosis or clubbing noted. Pedal hair growth normal.  Neurologic Normal speech. Oriented to person, place, and time. Epicritic sensation to light touch grossly present bilaterally.  Dermatologic Nails well groomed and normal in appearance. No open wounds. No skin lesions.  Orthopedic: Normal joint ROM without pain or crepitus bilaterally. No visible deformities. Tender to palpation at the calcaneal tuber bilaterally. No  pain with calcaneal squeeze bilaterally. Ankle ROM diminished range of motion bilaterally. Silfverskiold Test: positive bilaterally.   Radiographs: None  Assessment:   1. Plantar fasciitis, right   2. Plantar fasciitis, left      Plan:  Patient was evaluated and treated and all questions answered.  Plantar Fasciitis, bilaterally with underlying gastrocnemius equinus - XR reviewed as above.  - Educated on icing and stretching. Instructions given.  Will hold off on any further injection at this time - DME: Plantar fascial brace dispensed to support the medial longitudinal arch of the foot and offload pressure from the heel and prevent arch collapse during weightbearing - Pharmacologic management: Medrol Dosepak and meloxicam - Patient will think about surgical options - She has not done physical therapy   No follow-ups on file.

## 2023-09-05 ENCOUNTER — Encounter: Payer: PRIVATE HEALTH INSURANCE | Admitting: Family Medicine

## 2023-09-07 DIAGNOSIS — Z1231 Encounter for screening mammogram for malignant neoplasm of breast: Secondary | ICD-10-CM | POA: Diagnosis not present

## 2023-09-07 DIAGNOSIS — Z136 Encounter for screening for cardiovascular disorders: Secondary | ICD-10-CM | POA: Diagnosis not present

## 2023-09-07 DIAGNOSIS — D582 Other hemoglobinopathies: Secondary | ICD-10-CM | POA: Diagnosis not present

## 2023-09-07 DIAGNOSIS — R7303 Prediabetes: Secondary | ICD-10-CM | POA: Diagnosis not present

## 2023-09-07 DIAGNOSIS — Z1159 Encounter for screening for other viral diseases: Secondary | ICD-10-CM | POA: Diagnosis not present

## 2023-09-07 DIAGNOSIS — Z114 Encounter for screening for human immunodeficiency virus [HIV]: Secondary | ICD-10-CM | POA: Diagnosis not present

## 2023-09-26 ENCOUNTER — Encounter: Payer: PRIVATE HEALTH INSURANCE | Admitting: Family Medicine

## 2023-10-17 ENCOUNTER — Other Ambulatory Visit: Payer: Self-pay | Admitting: Family Medicine

## 2023-10-17 DIAGNOSIS — Z1231 Encounter for screening mammogram for malignant neoplasm of breast: Secondary | ICD-10-CM

## 2024-02-02 ENCOUNTER — Ambulatory Visit
Admission: EM | Admit: 2024-02-02 | Discharge: 2024-02-02 | Disposition: A | Discharge status: Home / Self Care | Payer: PRIVATE HEALTH INSURANCE | Attending: Emergency Medicine | Admitting: Emergency Medicine

## 2024-02-02 ENCOUNTER — Encounter: Payer: Self-pay | Admitting: Emergency Medicine

## 2024-02-02 DIAGNOSIS — I161 Hypertensive emergency: Secondary | ICD-10-CM | POA: Diagnosis not present

## 2024-02-02 DIAGNOSIS — R42 Dizziness and giddiness: Secondary | ICD-10-CM | POA: Diagnosis not present

## 2024-02-02 DIAGNOSIS — R110 Nausea: Secondary | ICD-10-CM | POA: Insufficient documentation

## 2024-02-02 LAB — RESP PANEL BY RT-PCR (FLU A&B, COVID) ARPGX2
Influenza A by PCR: NEGATIVE
Influenza B by PCR: NEGATIVE
SARS Coronavirus 2 by RT PCR: NEGATIVE

## 2024-02-02 NOTE — ED Triage Notes (Signed)
 Pt c/o dizziness and vomiting x2days  Pt denies headache, hypertension, blurry vision, abdominal pain, sore throat, cough and congestion  Pt denies any new foods.  Pt states she last vomited before arriving at the urgent care.  Pt denies having dizzy spells before.

## 2024-02-02 NOTE — Discharge Instructions (Addendum)
 Please go to the emergency department at [facility] to be evaluated for your dizziness, nausea, and elevated blood pressure.  Please go now.

## 2024-02-02 NOTE — ED Provider Notes (Addendum)
 [facility]    CSN: [id number] Arrival date & time: 02/02/24  1147      History   Chief Complaint Chief Complaint  Patient presents with   Dizziness   Vomiting    HPI KASHINA MECUM is a 43 y.o. female.   HPI  43 year old female with past medical history significant for sickle cell trait and hemoglobin C trait presents for evaluation of dizziness and nausea that began yesterday while at work.  They continued all day yesterday, were present when she went to bed, and was still present when she woke up this morning.  She denies any headache, changes in vision, chest pain, shortness of breath, numbness, tingling, weakness in any of her extremities.  No room spinning.  She reports that the only time she has had high blood pressure in the past was when she was pregnant with her daughter.  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 No pertinent surgical history.  OB History   No obstetric history on file.      Home Medications    Prior to Admission medications   Medication Sig Start Date End Date Taking? Authorizing Provider  albuterol  (PROVENTIL  HFA;VENTOLIN  HFA) 108 (90 BASE) MCG/ACT inhaler Inhale 2 puffs into the lungs every 6 (six) hours as needed for wheezing or shortness of breath. 10/02/14   Charlann Isaiah SQUIBB, MD  dicyclomine  (BENTYL ) 20 MG tablet Take 1 tablet (20 mg total) by mouth 4 (four) times daily -  before meals and at bedtime. 03/18/23   Arvis Jolan NOVAK, PA-C  ibuprofen  (ADVIL ) 800 MG tablet Take by mouth.    [provider]  Multiple Vitamin (MULTI-VITAMIN) tablet  Take 1 tablet by mouth daily.    [provider]    Family History History reviewed. No pertinent family history.  Social History Social History   Tobacco Use   Smoking status: Never   Smokeless tobacco: Never  Vaping Use   Vaping status: Never Used  Substance Use Topics   Alcohol use: Yes    Comment: occasionally    Drug use: No     Allergies   Patient has no known allergies.   Review of Systems Review of Systems  Eyes:  Negative for visual disturbance.  Respiratory:  Negative for shortness of breath.   Cardiovascular:  Negative for chest pain.  Gastrointestinal:  Positive for nausea.  Neurological:  Positive for dizziness. Negative for weakness, numbness and headaches.     Physical Exam Triage Vital Signs ED Triage Vitals  Encounter Vitals Group     BP      Girls Systolic BP Percentile      Girls Diastolic BP Percentile      Boys Systolic BP Percentile      Boys Diastolic BP Percentile      Pulse      Resp      Temp      Temp src      SpO2      Weight      Height      Head Circumference  Peak Flow      Pain Score      Pain Loc      Pain Education      Exclude from Growth Chart    Orthostatic VS for the past 24 hrs:  BP- Lying Pulse- Lying BP- Sitting Pulse- Sitting BP- Standing at 0 minutes Pulse- Standing at 0 minutes  02/02/24 1258 (!) 147/96 76 (!) 154/113 79 (!) 153/113 79    Updated Vital Signs BP (!) 148/108 (BP Location: Left Arm)   Pulse 80   Temp 98.3 F (36.8 C) (Oral)   Wt 266 lb 6.4 oz (120.8 kg)   LMP 01/03/2024   SpO2 100%   BMI 45.73 kg/m   Visual Acuity Right Eye Distance:   Left Eye Distance:   Bilateral Distance:    Right Eye Near:   Left Eye Near:    Bilateral Near:     Physical Exam Vitals and nursing note reviewed.  Constitutional:      Appearance: Normal appearance. She is not ill-appearing.  HENT:     Head: Normocephalic and atraumatic.  Cardiovascular:     Rate and Rhythm: Normal rate and  regular rhythm.     Pulses: Normal pulses.     Heart sounds: Normal heart sounds. No murmur heard.    No friction rub. No gallop.  Pulmonary:     Effort: Pulmonary effort is normal.     Breath sounds: Normal breath sounds. No wheezing, rhonchi or rales.  Skin:    General: Skin is warm and dry.     Capillary Refill: Capillary refill takes less than 2 seconds.     Findings: No rash.  Neurological:     General: No focal deficit present.     Mental Status: She is alert and oriented to person, place, and time.     Cranial Nerves: No cranial nerve deficit.     Sensory: No sensory deficit.     Motor: No weakness.     Coordination: Coordination normal.     Gait: Gait normal.     Deep Tendon Reflexes: Reflexes normal.      UC Treatments / Results  Labs (all labs ordered are listed, but only abnormal results are displayed) Labs Reviewed  RESP PANEL BY RT-PCR (FLU A&B, COVID) [id number]    EKG   Radiology No results found.  Procedures Procedures (including critical care time)  Medications Ordered in UC Medications - No data to display  Initial Impression / Assessment and Plan / UC Course  I have reviewed the triage vital signs and the nursing notes.  Pertinent labs & imaging results that were available during my care of the patient were reviewed by me and considered in my medical decision making (see chart for details).   Patient is a pleasant 43 year old female presenting for evaluation of acute onset of dizziness and nausea that started yesterday while she was at work and has remained present despite going to sleep.  No associated room spinning.  She has no previous history of vertigo.  She is not complaining of any upper or lower respiratory symptoms, chest pain, or ear fullness.  She states that her dizziness is more feeling of being off balance.  Cranial nerves II through XII are intact.  Pupils are equal and reactive and EOM's intact.  Patient is moving all extremities  independently and her bilateral grips are 5/5.  Upper and lower extremity strength is also 5/5.  DTRs are 1+ globally.  She has a normal  finger-to-nose and heel-to-shin.  I had ordered a respiratory panel at triage to assess for COVID or flu though given that she not experiencing any symptoms I do not think those are contributing to her symptoms.  I also ordered orthostatics because she has been experiencing the dizziness and nausea.  Blood pressure was initially at triage 148/108.  With orthostatic vital signs her blood pressure improved slightly to 147/96 while lying and then increased to 154/113 with sitting and 153/113 with standing.  I am suspicious that the patient's dizziness and nausea are secondary to her elevated blood pressure.  I have advised her that we do not have any medication here in clinic to bring her blood pressure down.  Given that she is not experiencing any headache and she is neurologically intact I do feel that she is safe to travel to the emergency department via POV.  She has elected to go to [facility]  Respiratory panel negative for COVID or influenza.   Final Clinical Impressions(s) / UC Diagnoses   Final diagnoses:  Hypertensive emergency  Dizziness  Nausea without vomiting     Discharge Instructions      Please go to the emergency department at [facility] to be evaluated for your dizziness, nausea, and elevated blood pressure.  Please go now.     ED Prescriptions   None    PDMP not reviewed this encounter.   Bernardino Ditch, NP 02/02/24 1313    Bernardino Ditch, NP 02/02/24 1313    Bernardino Ditch, NP 02/02/24 1352

## 2024-03-06 ENCOUNTER — Ambulatory Visit (INDEPENDENT_AMBULATORY_CARE_PROVIDER_SITE_OTHER): Payer: PRIVATE HEALTH INSURANCE

## 2024-03-06 ENCOUNTER — Ambulatory Visit
Admission: EM | Admit: 2024-03-06 | Discharge: 2024-03-06 | Disposition: A | Discharge status: Home / Self Care | Payer: PRIVATE HEALTH INSURANCE | Attending: Emergency Medicine | Admitting: Emergency Medicine

## 2024-03-06 DIAGNOSIS — K2100 Gastro-esophageal reflux disease with esophagitis, without bleeding: Secondary | ICD-10-CM

## 2024-03-06 DIAGNOSIS — R112 Nausea with vomiting, unspecified: Secondary | ICD-10-CM

## 2024-03-06 DIAGNOSIS — R051 Acute cough: Secondary | ICD-10-CM | POA: Diagnosis not present

## 2024-03-06 LAB — POCT URINE DIPSTICK
Bilirubin, UA: NEGATIVE
Glucose, UA: NEGATIVE mg/dL
Ketones, POC UA: NEGATIVE mg/dL
Nitrite, UA: NEGATIVE
Protein Ur, POC: NEGATIVE mg/dL
Spec Grav, UA: 1.01 (ref 1.010–1.025)
Urobilinogen, UA: 0.2 U/dL
pH, UA: 6 (ref 5.0–8.0)

## 2024-03-06 LAB — POCT URINE PREGNANCY: Preg Test, Ur: NEGATIVE

## 2024-03-06 MED ORDER — ALUM & MAG HYDROXIDE-SIMETH 200-200-20 MG/5ML PO SUSP
30.0000 mL | Freq: Once | ORAL | Status: AC
  Administered 2024-03-06: 30 mL via ORAL

## 2024-03-06 MED ORDER — FAMOTIDINE 20 MG PO TABS: 20.0000 mg | ORAL_TABLET | Freq: Two times a day (BID) | ORAL | 0 refills | Status: AC

## 2024-03-06 MED ORDER — PANTOPRAZOLE SODIUM 20 MG PO TBEC: 20.0000 mg | DELAYED_RELEASE_TABLET | Freq: Every day | ORAL | 0 refills | Status: AC

## 2024-03-06 MED ORDER — LIDOCAINE VISCOUS HCL 2 % MT SOLN
15.0000 mL | Freq: Once | OROMUCOSAL | Status: AC
  Administered 2024-03-06: 15 mL via OROMUCOSAL

## 2024-03-06 NOTE — ED Provider Notes (Signed)
 HPI  SUBJECTIVE:  Danielle Pineda is a 43 y.o. female who presents with a week of nonbilious nonbloody emesis after she tries to eat solids, dry cough, burning throat pain when she swallows, substernal burning chest pain present after coughing only, throat burning, nausea, shortness of breath when she lies down at night only, and some dyspnea on exertion.  She reports having to sleep on 3 pillows and nocturia, but is attributing this to the fact that she is subsisting solely on fluids.  She is able to keep fluids down.  No belching, waterbrash, abdominal pain, fevers, wheezing, unintentional weight gain or loss, lower extremity edema, PND.  No surgery in the past 12 weeks, hemoptysis, recent immobilization, exogenous estrogen.  She has been subsisting on a liquid diet without any problem.  No alleviating factors.  Symptoms are worse when she tries to eat solids.  She has a past medical history of sickle cell trait, BMI above 30.  No history of GERD, pulm disease, smoking, diabetes, hypertension, coronary disease, hypercholesterolemia, CVA, PAD/PVD, MI, gallbladder disease, pancreatitis, PE/DVT, smoking, cancer, PUD, CHF.  She drinks alcohol on weekends only.  No regular NSAID use.  Family history significant for early MI in her mom.  LMP: Beginning of October.  Not sure if she could be pregnant. She had a negative pregnancy test 5 days ago at her PCPs office. PCP: [facility].  Past Medical History:  Diagnosis Date   Sickle cell trait     History reviewed. No pertinent surgical history.  History reviewed. No pertinent family history.  Social History   Tobacco Use   Smoking status: Never   Smokeless tobacco: Never  Vaping Use   Vaping status: Never Used  Substance Use Topics   Alcohol use: Yes    Comment: occasionally    Drug use: No    No current facility-administered medications for this encounter.  Current Outpatient Medications:    albuterol  (PROVENTIL  HFA;VENTOLIN  HFA)  108 (90 BASE) MCG/ACT inhaler, Inhale 2 puffs into the lungs every 6 (six) hours as needed for wheezing or shortness of breath., Disp: 1 Inhaler, Rfl: 0   famotidine (PEPCID) 20 MG tablet, Take 1 tablet (20 mg total) by mouth 2 (two) times daily., Disp: 40 tablet, Rfl: 0   ibuprofen  (ADVIL ) 800 MG tablet, Take by mouth., Disp: , Rfl:    Multiple Vitamin (MULTI-VITAMIN) tablet, Take 1 tablet by mouth daily., Disp: , Rfl:    pantoprazole (PROTONIX) 20 MG tablet, Take 1 tablet (20 mg total) by mouth daily., Disp: 30 tablet, Rfl: 0  No Known Allergies   ROS  As noted in HPI.   Physical Exam  BP (!) 140/99 (BP Location: Left Arm)   Pulse 81   Temp 98.7 F (37.1 C) (Oral)   Wt 123.7 kg   LMP 01/25/2024   SpO2 100%   BMI 46.79 kg/m   Constitutional: Well developed, well nourished, no acute distress Eyes: PERRL, EOMI, conjunctiva normal bilaterally HENT: Normocephalic, atraumatic,mucus membranes moist Respiratory: Clear to auscultation bilaterally, no rales, no wheezing, no rhonchi.  Positive reproducible substernal chest wall tenderness. Cardiovascular: Normal rate and rhythm, no murmurs, no gallops, no rubs GI: nondistended soft, nontender, active bowel sounds.  No rebound, guarding.  Negative Murphy. skin: No rash, skin intact Musculoskeletal: Calves symmetric, nontender, no edema Neurologic: Alert & oriented x 3, CN III-XII grossly intact, no motor deficits, sensation grossly intact Psychiatric: Speech and behavior appropriate   ED Course   Medications  alum &  mag hydroxide-simeth (MAALOX/MYLANTA) 200-200-20 MG/5ML suspension 30 mL (30 mLs Oral Given 03/06/24 1532)  lidocaine (XYLOCAINE) 2 % viscous mouth solution 15 mL (15 mLs Mouth/Throat Given 03/06/24 1532)    Orders Placed This Encounter  Procedures   DG Chest 2 View    Standing Status:   Standing    Number of Occurrences:   1    Reason for Exam (SYMPTOM  OR DIAGNOSIS REQUIRED):   Dry cough, shortness of breath for  1 week, vomiting.  Rule out hiatal hernia, pneumonia, pulmonary edema, pleural effusion.   Ambulatory referral to Gastroenterology    Referral Priority:   Routine    Referral Type:   Consultation    Referral Reason:   Specialty Services Required    Number of Visits Requested:   1   POC Urinalysis Dipstick    Standing Status:   Standing    Number of Occurrences:   1   POCT urine pregnancy    Standing Status:   Standing    Number of Occurrences:   1   ED EKG    Standing Status:   Standing    Number of Occurrences:   1    Reason for Exam:   Shortness of breath   EKG 12-Lead    Standing Status:   Standing    Number of Occurrences:   1   Results for orders placed or performed during the hospital encounter of 03/06/24 (from the past 24 hours)  POC Urinalysis Dipstick     Status: Abnormal   Collection Time: 03/06/24  3:57 PM  Result Value Ref Range   Color, UA yellow yellow   Clarity, UA clear clear   Glucose, UA negative negative mg/dL   Bilirubin, UA negative negative   Ketones, POC UA negative negative mg/dL   Spec Grav, UA 8.989 8.989 - 1.025   Blood, UA trace-intact (A) negative   pH, UA 6.0 5.0 - 8.0   Protein Ur, POC negative negative mg/dL   Urobilinogen, UA 0.2 0.2 or 1.0 E.U./dL   Nitrite, UA Negative Negative   Leukocytes, UA Trace (A) Negative  POCT urine pregnancy     Status: Normal   Collection Time: 03/06/24  3:57 PM  Result Value Ref Range   Preg Test, Ur Negative Negative   DG Chest 2 View Result Date: 03/06/2024 CLINICAL DATA:  Dry cough EXAM: CHEST - 2 VIEW COMPARISON:  10/02/2014 FINDINGS: The heart size and mediastinal contours are within normal limits. Both lungs are clear. The visualized skeletal structures are unremarkable. IMPRESSION: No active cardiopulmonary disease. Electronically Signed   By: Luke Bun M.D.   On: 03/06/2024 16:13    ED Clinical Impression  1. Gastroesophageal reflux disease with esophagitis without hemorrhage   2. Nausea and  vomiting, unspecified vomiting type   3. Acute cough      ED Assessment/Plan     Outside records, labs reviewed.  As noted in HPI.  Patient PERC negative.  Low suspicion for PE.    EKG: Normal sinus rhythm, rate 76.  Normal axis, normal pulse.  No hypertrophy.  Inverted T wave in 3, no other ST-T wave changes.  No change compared to EKG from 2016.  Patient symptomatic while EKG was obtained.  HEART score:   History: Slightly suspicious 0 EKG: Normal 0 Age: Below 45 0 Risk factors: 2 risk factors +1 Troponin: Not available  Total score 1.  Patient is at low risk for 30-day MACE.   Imaging independently reviewed.  No acute cardiopulmonary disease.  See radiology report for details.  Her abdomen is benign, no peritoneal signs. No evidence of surgical abd. Doubt SBO, mesenteric ischemia, appendicitis, hepatitis, cholecystitis, pancreatitis, or perforated viscus.  discussed with patient that this could be hiatal hernia, GERD, esophagitis, congestive heart failure, gallbladder disease.  Low suspicion for ACS.  Discussed with her that I have very limited resources at the urgent care and we are no longer able to do comprehensive lab work to evaluate for intra-abdominal pathology.  We were able to check a UA, urine pregnancy, chest x-ray and EKG and give her a GI cocktail, or we could transfer her to the emergency department for comprehensive evaluation to rule out any emergent cause of her symptoms.  Patient would like to do a limited workup here and will go to the emergency department for any change in her symptoms or if they get worse.  Urine pregnancy negative.  She has leukocytes and RBCs, but she has no urinary complaints.  Will not treat or send off for culture.  On reevaluation, patient notes she feels somewhat better after the GI cocktail.  Suspect GERD/esophagitis.  EKG, chest x-ray reassuring.  Low suspicion for PE, ACS.  Will send home with Pepcid, Protonix, and refer to GI if  symptoms persist in a week or 2.  Strict ER return precautions given.  Discussed labs, imaging, MDM, treatment plan, and plan for follow-up with patient Discussed sn/sx that should prompt return to the ED. patient agrees with plan.   Meds ordered this encounter  Medications   alum & mag hydroxide-simeth (MAALOX/MYLANTA) 200-200-20 MG/5ML suspension 30 mL   lidocaine (XYLOCAINE) 2 % viscous mouth solution 15 mL   pantoprazole (PROTONIX) 20 MG tablet    Sig: Take 1 tablet (20 mg total) by mouth daily.    Dispense:  30 tablet    Refill:  0   famotidine (PEPCID) 20 MG tablet    Sig: Take 1 tablet (20 mg total) by mouth 2 (two) times daily.    Dispense:  40 tablet    Refill:  0      *This clinic note was created using Scientist, clinical (histocompatibility and immunogenetics). Therefore, there may be occasional mistakes despite careful proofreading. ?    Van Knee, MD 03/06/24 1652

## 2024-03-06 NOTE — Discharge Instructions (Addendum)
 Chest Xray, EKG was normal, urine pregnancy negative  Try the Pepcid and Protonix.  Elevate head of the bed to 30 degrees.  I have put in a referral to GI if your symptoms persist in a week or 2.  Go to the ER if your symptoms change, get worse, if you are unable to keep anything down including liquids, have not urinated in 12 hours, or for any other concerns.

## 2024-03-06 NOTE — ED Triage Notes (Signed)
 Pt c/o SOB, cough, vomiting x1week

## 2024-03-08 ENCOUNTER — Emergency Department
Admission: EM | Admit: 2024-03-08 | Discharge: 2024-03-08 | Disposition: A | Discharge status: Home / Self Care | Payer: PRIVATE HEALTH INSURANCE | Attending: Emergency Medicine | Admitting: Emergency Medicine

## 2024-03-08 ENCOUNTER — Other Ambulatory Visit: Payer: Self-pay

## 2024-03-08 ENCOUNTER — Emergency Department: Payer: PRIVATE HEALTH INSURANCE

## 2024-03-08 DIAGNOSIS — R0789 Other chest pain: Secondary | ICD-10-CM

## 2024-03-08 DIAGNOSIS — J069 Acute upper respiratory infection, unspecified: Secondary | ICD-10-CM | POA: Insufficient documentation

## 2024-03-08 DIAGNOSIS — R079 Chest pain, unspecified: Secondary | ICD-10-CM | POA: Diagnosis not present

## 2024-03-08 DIAGNOSIS — R059 Cough, unspecified: Secondary | ICD-10-CM | POA: Diagnosis present

## 2024-03-08 LAB — RESP PANEL BY RT-PCR (RSV, FLU A&B, COVID)  RVPGX2
Influenza A by PCR: NEGATIVE
Influenza B by PCR: NEGATIVE
Resp Syncytial Virus by PCR: NEGATIVE
SARS Coronavirus 2 by RT PCR: NEGATIVE

## 2024-03-08 MED ORDER — BENZONATATE 100 MG PO CAPS: 100.0000 mg | ORAL_CAPSULE | Freq: Three times a day (TID) | ORAL | 0 refills | Status: AC | PRN

## 2024-03-08 MED ORDER — ALBUTEROL SULFATE HFA 108 (90 BASE) MCG/ACT IN AERS: 2.0000 | INHALATION_SPRAY | Freq: Four times a day (QID) | RESPIRATORY_TRACT | 0 refills | Status: AC | PRN

## 2024-03-08 MED ORDER — DEXAMETHASONE 4 MG PO TABS
4.0000 mg | ORAL_TABLET | Freq: Once | ORAL | Status: AC
  Administered 2024-03-08: 4 mg via ORAL
  Filled 2024-03-08 (×2): qty 1

## 2024-03-08 MED ORDER — IPRATROPIUM-ALBUTEROL 0.5-2.5 (3) MG/3ML IN SOLN
3.0000 mL | Freq: Once | RESPIRATORY_TRACT | Status: AC
  Administered 2024-03-08: 3 mL via RESPIRATORY_TRACT
  Filled 2024-03-08: qty 3

## 2024-03-08 NOTE — Discharge Instructions (Signed)
 You were seen in the ER today for evaluation of your cough and congestion.  Your x-Krish Bailly fortunately did not show signs of a pneumonia.  I suspect you likely have a viral infection of your upper respiratory tract that is causing your symptoms.  You can take Tylenol and ibuprofen  to help with your chest wall pain.  I sent a prescription for cough medicine called Tessalon  Perles to your pharmacy.  Able to send a prescription for an albuterol  inhaler to see if this helps with your cough.  Follow-up with your primary care doctor for further evaluation.  Return to the ER for new or worsening symptoms.

## 2024-03-08 NOTE — ED Provider Notes (Signed)
 [facility] Provider Note    Event Date/Time   First MD Initiated Contact with Patient 03/08/24 [phone]     (approximate)   History   Chest Pain   HPI  Danielle Pineda is a 43 year old female presenting to the emergency department for evaluation of cough and congestion.  Patient reports that she has had ongoing cough for the past few days.  Reports chest pain when coughing, otherwise denies chest pain.  Does report a coworker was recently sick with similar symptoms.  Was seen in urgent care a few days ago with burning throat pain and vomiting.  Thought to be related to GERD.  Patient reports resolution of her vomiting and denies any abdominal pain.  Presents given worsened cough and congestion.  No history of asthma or COPD.     Physical Exam   Triage Vital Signs: ED Triage Vitals  Encounter Vitals Group     BP 03/08/24 0427 118/68     Girls Systolic BP Percentile --      Girls Diastolic BP Percentile --      Boys Systolic BP Percentile --      Boys Diastolic BP Percentile --      Pulse Rate 03/08/24 0427 87     Resp 03/08/24 0427 19     Temp 03/08/24 0427 99.2 F (37.3 C)     Temp src --      SpO2 03/08/24 0427 100 %     Weight 03/08/24 0423 265 lb (120.2 kg)     Height 03/08/24 0423 5' 3 (1.6 m)     Head Circumference --      Peak Flow --      Pain Score 03/08/24 0423 8     Pain Loc --      Pain Education --      Exclude from Growth Chart --     Most recent vital signs: Vitals:   03/08/24 0427  BP: 118/68  Pulse: 87  Resp: 19  Temp: 99.2 F (37.3 C)  SpO2: 100%     General: Awake, interactive  CV:  Good peripheral perfusion Resp:  Unlabored respirations, diminished lung sounds at bases without frank wheezing Chest wall: Tender to palpation Abd:  Nondistended, soft, nontender Neuro:  Symmetric facial movement, fluid speech   ED Results / Procedures / Treatments   Labs (all labs ordered are listed, but only abnormal results  are displayed) Labs Reviewed  RESP PANEL BY RT-PCR (RSV, FLU A&B, COVID)  [id number]     EKG EKG independently reviewed and interpreted by myself demonstrates:  EKG demonstrates sinus rhythm at a rate of 89, PR 166, QRS 78, QTc 428, no acute ST changes  RADIOLOGY Imaging independently reviewed and interpreted by myself demonstrates:  X-Anastasio Wogan without focal consolidation  Formal Radiology Read:  DG Chest Portable 1 View Result Date: 03/08/2024 EXAM: 1 VIEW(S) XRAY OF THE CHEST 03/08/2024 04:50:11 AM COMPARISON: 03/06/2024 CLINICAL HISTORY: cp cough FINDINGS: LUNGS AND PLEURA: Low lung volumes. No focal pulmonary opacity. No pleural effusion. No pneumothorax. HEART AND MEDIASTINUM: No acute abnormality of the cardiac and mediastinal silhouettes. BONES AND SOFT TISSUES: No acute osseous abnormality. IMPRESSION: 1. No acute process. 2. Low lung volumes. Electronically signed by: Evalene Coho MD 03/08/2024 04:57 AM EST RP Workstation: [id number]    PROCEDURES:  Critical Care performed: No  Procedures   MEDICATIONS ORDERED IN ED: Medications  ipratropium-albuterol  (DUONEB) 0.5-2.5 (3) MG/3ML nebulizer solution 3 mL (3 mLs Nebulization Given 03/08/24  [id number])  dexamethasone (DECADRON) tablet 4 mg (4 mg Oral Given 03/08/24 0512)     IMPRESSION / MDM / ASSESSMENT AND PLAN / ED COURSE  I reviewed the triage vital signs and the nursing notes.  Differential diagnosis includes, but is not limited to, viral illness, pneumonia, very low suspicion ACS given clinical history, low risk PE and PERC negative  Patient's presentation is most consistent with acute illness / injury with system symptoms.  43 year old female presenting to the emergency department for evaluation of cough and congestion.  Stable vitals on presentation.  X-Emannuel Vise without obvious focal consolidation.  EKG without acute ischemic changes.  As this is her second visit in 2 days, I did consider and discussed lab work.  Patient reports  her abdominal symptoms are resolved and her main concern is cough, would prefer to hold off on labs which I do think is reasonable.  Will trial DuoNeb and steroids given some diminished lung sounds though she does not have underlying known respiratory disease.  On reassessment, patient does report improved symptoms.  With this, will DC with prescription for albuterol  inhaler as well as Tessalon  Perles for cough.  Discussed supportive care in setting of likely URI.  Strict return precautions provided.  Patient discharged in stable condition.      FINAL CLINICAL IMPRESSION(S) / ED DIAGNOSES   Final diagnoses:  Upper respiratory tract infection, unspecified type  Chest wall pain     Rx / DC Orders   ED Discharge Orders          Ordered    albuterol  (VENTOLIN  HFA) 108 (90 Base) MCG/ACT inhaler  Every 6 hours PRN        03/08/24 0532             Note:  This document was prepared using Dragon voice recognition software and may include unintentional dictation errors.   Levander Slate, MD 03/08/24 [phone]

## 2024-03-08 NOTE — ED Triage Notes (Signed)
 Pt reports chest pain for the past few days associated with cough and congestion.

## 2024-04-02 ENCOUNTER — Telehealth: Payer: Self-pay

## 2024-04-02 NOTE — Telephone Encounter (Signed)
 Per pt feeling better pt states no need to see GI md
